# Patient Record
Sex: Female | Born: 1970 | Race: Black or African American | Hispanic: No | Marital: Single | Smoking: Current every day smoker
Health system: Southern US, Community
[De-identification: ages and names within clinical notes are randomized; demographics above are authoritative.]

## PROBLEM LIST (undated history)

## (undated) DIAGNOSIS — J439 Emphysema, unspecified: Secondary | ICD-10-CM

## (undated) DIAGNOSIS — I10 Essential (primary) hypertension: Secondary | ICD-10-CM

## (undated) DIAGNOSIS — J45909 Unspecified asthma, uncomplicated: Secondary | ICD-10-CM

## (undated) DIAGNOSIS — G43909 Migraine, unspecified, not intractable, without status migrainosus: Secondary | ICD-10-CM

## (undated) DIAGNOSIS — G4730 Sleep apnea, unspecified: Secondary | ICD-10-CM

## (undated) HISTORY — PX: ABDOMINAL HYSTERECTOMY: SHX81

## (undated) HISTORY — DX: Emphysema, unspecified: J43.9

---

## 1999-03-24 ENCOUNTER — Other Ambulatory Visit: Admission: RE | Admit: 1999-03-24 | Discharge: 1999-03-24 | Payer: Self-pay | Admitting: Gynecology

## 2000-01-03 ENCOUNTER — Inpatient Hospital Stay (HOSPITAL_COMMUNITY): Admission: AD | Admit: 2000-01-03 | Discharge: 2000-01-03 | Payer: Self-pay | Admitting: *Deleted

## 2000-01-14 ENCOUNTER — Emergency Department (HOSPITAL_COMMUNITY): Admission: EM | Admit: 2000-01-14 | Discharge: 2000-01-14 | Payer: Self-pay | Admitting: Emergency Medicine

## 2000-05-15 ENCOUNTER — Emergency Department (HOSPITAL_COMMUNITY): Admission: EM | Admit: 2000-05-15 | Discharge: 2000-05-15 | Payer: Self-pay | Admitting: Emergency Medicine

## 2000-05-29 ENCOUNTER — Emergency Department (HOSPITAL_COMMUNITY): Admission: EM | Admit: 2000-05-29 | Discharge: 2000-05-29 | Payer: Self-pay | Admitting: *Deleted

## 2000-07-01 ENCOUNTER — Encounter: Admission: RE | Admit: 2000-07-01 | Discharge: 2000-08-18 | Payer: Self-pay | Admitting: Orthopedic Surgery

## 2000-09-07 ENCOUNTER — Encounter: Admission: RE | Admit: 2000-09-07 | Discharge: 2000-09-28 | Payer: Self-pay | Admitting: Orthopedic Surgery

## 2000-09-19 ENCOUNTER — Encounter: Payer: Self-pay | Admitting: Emergency Medicine

## 2000-09-19 ENCOUNTER — Emergency Department (HOSPITAL_COMMUNITY): Admission: EM | Admit: 2000-09-19 | Discharge: 2000-09-19 | Payer: Self-pay | Admitting: Emergency Medicine

## 2000-12-22 ENCOUNTER — Emergency Department (HOSPITAL_COMMUNITY): Admission: EM | Admit: 2000-12-22 | Discharge: 2000-12-23 | Payer: Self-pay | Admitting: Emergency Medicine

## 2001-03-30 ENCOUNTER — Emergency Department (HOSPITAL_COMMUNITY): Admission: EM | Admit: 2001-03-30 | Discharge: 2001-03-30 | Payer: Self-pay | Admitting: *Deleted

## 2001-10-02 ENCOUNTER — Inpatient Hospital Stay (HOSPITAL_COMMUNITY): Admission: AD | Admit: 2001-10-02 | Discharge: 2001-10-02 | Payer: Self-pay | Admitting: Family Medicine

## 2002-03-05 ENCOUNTER — Emergency Department (HOSPITAL_COMMUNITY): Admission: EM | Admit: 2002-03-05 | Discharge: 2002-03-05 | Payer: Self-pay | Admitting: Emergency Medicine

## 2002-06-17 ENCOUNTER — Encounter (INDEPENDENT_AMBULATORY_CARE_PROVIDER_SITE_OTHER): Payer: Self-pay | Admitting: *Deleted

## 2002-06-17 LAB — CONVERTED CEMR LAB

## 2002-08-22 ENCOUNTER — Encounter: Admission: RE | Admit: 2002-08-22 | Discharge: 2002-08-22 | Payer: Self-pay | Admitting: Family Medicine

## 2002-10-20 ENCOUNTER — Encounter: Admission: RE | Admit: 2002-10-20 | Discharge: 2002-10-20 | Payer: Self-pay | Admitting: Family Medicine

## 2002-11-20 ENCOUNTER — Encounter: Admission: RE | Admit: 2002-11-20 | Discharge: 2002-11-20 | Payer: Self-pay | Admitting: Family Medicine

## 2002-12-04 ENCOUNTER — Encounter: Admission: RE | Admit: 2002-12-04 | Discharge: 2002-12-04 | Payer: Self-pay | Admitting: Family Medicine

## 2002-12-13 ENCOUNTER — Encounter: Admission: RE | Admit: 2002-12-13 | Discharge: 2002-12-13 | Payer: Self-pay | Admitting: Family Medicine

## 2002-12-21 ENCOUNTER — Emergency Department (HOSPITAL_COMMUNITY): Admission: EM | Admit: 2002-12-21 | Discharge: 2002-12-21 | Payer: Self-pay | Admitting: Emergency Medicine

## 2003-01-03 ENCOUNTER — Encounter: Admission: RE | Admit: 2003-01-03 | Discharge: 2003-01-03 | Payer: Self-pay | Admitting: Family Medicine

## 2003-03-02 ENCOUNTER — Encounter: Admission: RE | Admit: 2003-03-02 | Discharge: 2003-03-02 | Payer: Self-pay | Admitting: Family Medicine

## 2003-03-27 ENCOUNTER — Ambulatory Visit (HOSPITAL_BASED_OUTPATIENT_CLINIC_OR_DEPARTMENT_OTHER): Admission: RE | Admit: 2003-03-27 | Discharge: 2003-03-27 | Payer: Self-pay | Admitting: Sports Medicine

## 2003-03-27 ENCOUNTER — Encounter: Admission: RE | Admit: 2003-03-27 | Discharge: 2003-03-27 | Payer: Self-pay | Admitting: Family Medicine

## 2003-04-24 ENCOUNTER — Encounter: Admission: RE | Admit: 2003-04-24 | Discharge: 2003-04-24 | Payer: Self-pay | Admitting: Family Medicine

## 2003-05-29 ENCOUNTER — Encounter: Admission: RE | Admit: 2003-05-29 | Discharge: 2003-05-29 | Payer: Self-pay | Admitting: Sports Medicine

## 2003-07-10 ENCOUNTER — Encounter: Admission: RE | Admit: 2003-07-10 | Discharge: 2003-07-10 | Payer: Self-pay | Admitting: Sports Medicine

## 2003-07-10 ENCOUNTER — Encounter: Payer: Self-pay | Admitting: Sports Medicine

## 2003-08-03 ENCOUNTER — Emergency Department (HOSPITAL_COMMUNITY): Admission: EM | Admit: 2003-08-03 | Discharge: 2003-08-03 | Payer: Self-pay | Admitting: Emergency Medicine

## 2003-09-11 ENCOUNTER — Inpatient Hospital Stay (HOSPITAL_COMMUNITY): Admission: AD | Admit: 2003-09-11 | Discharge: 2003-09-11 | Payer: Self-pay | Admitting: *Deleted

## 2003-10-05 ENCOUNTER — Encounter: Admission: RE | Admit: 2003-10-05 | Discharge: 2003-10-05 | Payer: Self-pay | Admitting: Family Medicine

## 2003-10-31 ENCOUNTER — Ambulatory Visit: Payer: Self-pay | Admitting: Family Medicine

## 2003-12-13 ENCOUNTER — Ambulatory Visit: Payer: Self-pay | Admitting: Family Medicine

## 2004-01-01 ENCOUNTER — Ambulatory Visit: Payer: Self-pay | Admitting: Sports Medicine

## 2004-02-14 ENCOUNTER — Ambulatory Visit: Payer: Self-pay | Admitting: Hematology and Oncology

## 2004-02-14 ENCOUNTER — Observation Stay (HOSPITAL_COMMUNITY): Admission: AD | Admit: 2004-02-14 | Discharge: 2004-02-15 | Payer: Self-pay | Admitting: Obstetrics

## 2004-02-19 ENCOUNTER — Ambulatory Visit: Payer: Self-pay | Admitting: Hematology and Oncology

## 2004-03-06 ENCOUNTER — Inpatient Hospital Stay (HOSPITAL_COMMUNITY): Admission: RE | Admit: 2004-03-06 | Discharge: 2004-03-08 | Payer: Self-pay | Admitting: Obstetrics & Gynecology

## 2004-03-06 ENCOUNTER — Encounter (INDEPENDENT_AMBULATORY_CARE_PROVIDER_SITE_OTHER): Payer: Self-pay | Admitting: Specialist

## 2004-04-11 ENCOUNTER — Ambulatory Visit: Payer: Self-pay | Admitting: Hematology and Oncology

## 2004-05-16 ENCOUNTER — Ambulatory Visit: Payer: Self-pay | Admitting: Sports Medicine

## 2004-05-19 ENCOUNTER — Ambulatory Visit: Payer: Self-pay | Admitting: Family Medicine

## 2004-07-18 ENCOUNTER — Ambulatory Visit: Payer: Self-pay | Admitting: Family Medicine

## 2004-09-05 ENCOUNTER — Ambulatory Visit: Payer: Self-pay | Admitting: Family Medicine

## 2004-09-11 ENCOUNTER — Emergency Department (HOSPITAL_COMMUNITY): Admission: EM | Admit: 2004-09-11 | Discharge: 2004-09-11 | Payer: Self-pay | Admitting: Emergency Medicine

## 2004-09-26 ENCOUNTER — Ambulatory Visit: Payer: Self-pay | Admitting: Family Medicine

## 2004-10-02 ENCOUNTER — Ambulatory Visit: Payer: Self-pay | Admitting: Family Medicine

## 2004-10-03 ENCOUNTER — Emergency Department (HOSPITAL_COMMUNITY): Admission: EM | Admit: 2004-10-03 | Discharge: 2004-10-03 | Payer: Self-pay | Admitting: Family Medicine

## 2004-10-15 ENCOUNTER — Ambulatory Visit: Payer: Self-pay | Admitting: Family Medicine

## 2004-11-13 ENCOUNTER — Ambulatory Visit: Payer: Self-pay | Admitting: Family Medicine

## 2004-11-24 ENCOUNTER — Ambulatory Visit (HOSPITAL_COMMUNITY): Admission: RE | Admit: 2004-11-24 | Discharge: 2004-11-24 | Payer: Self-pay | Admitting: Family Medicine

## 2005-01-02 ENCOUNTER — Ambulatory Visit: Payer: Self-pay | Admitting: Family Medicine

## 2005-01-27 ENCOUNTER — Ambulatory Visit: Payer: Self-pay | Admitting: Family Medicine

## 2005-02-27 ENCOUNTER — Ambulatory Visit: Payer: Self-pay | Admitting: Sports Medicine

## 2005-05-04 ENCOUNTER — Ambulatory Visit: Payer: Self-pay | Admitting: Family Medicine

## 2005-05-25 ENCOUNTER — Emergency Department (HOSPITAL_COMMUNITY): Admission: EM | Admit: 2005-05-25 | Discharge: 2005-05-25 | Payer: Self-pay | Admitting: Emergency Medicine

## 2005-06-26 ENCOUNTER — Ambulatory Visit: Payer: Self-pay | Admitting: Family Medicine

## 2005-07-03 ENCOUNTER — Ambulatory Visit: Payer: Self-pay | Admitting: Family Medicine

## 2005-07-31 ENCOUNTER — Encounter: Admission: RE | Admit: 2005-07-31 | Discharge: 2005-07-31 | Payer: Self-pay | Admitting: Sports Medicine

## 2005-07-31 ENCOUNTER — Ambulatory Visit: Payer: Self-pay | Admitting: Family Medicine

## 2005-09-11 ENCOUNTER — Emergency Department (HOSPITAL_COMMUNITY): Admission: EM | Admit: 2005-09-11 | Discharge: 2005-09-11 | Payer: Self-pay | Admitting: Emergency Medicine

## 2005-09-21 ENCOUNTER — Ambulatory Visit: Payer: Self-pay | Admitting: Family Medicine

## 2005-10-14 ENCOUNTER — Emergency Department (HOSPITAL_COMMUNITY): Admission: EM | Admit: 2005-10-14 | Discharge: 2005-10-14 | Payer: Self-pay | Admitting: Emergency Medicine

## 2005-11-09 ENCOUNTER — Emergency Department (HOSPITAL_COMMUNITY): Admission: EM | Admit: 2005-11-09 | Discharge: 2005-11-09 | Payer: Self-pay | Admitting: Emergency Medicine

## 2006-01-11 ENCOUNTER — Ambulatory Visit: Payer: Self-pay | Admitting: Family Medicine

## 2006-01-22 IMAGING — CR DG CHEST 2V
2 series · 2 of 2 positions shown · non-contrast
Comparison: none

CLINICAL DATA: History of asthma.  Question sarcoid.
 CHEST X-RAY
 Two views of the chest are compared to a dictation of a chest x-ray from [HOSPITAL] dated 09/19/00.  The lungs are clear.  The heart is within upper limits of normal.  No adenopathy is seen.  No bony abnormality is noted.
 IMPRESSION
 No active lung disease.  No adenopathy is seen.

[view not recorded (1 of 2)]
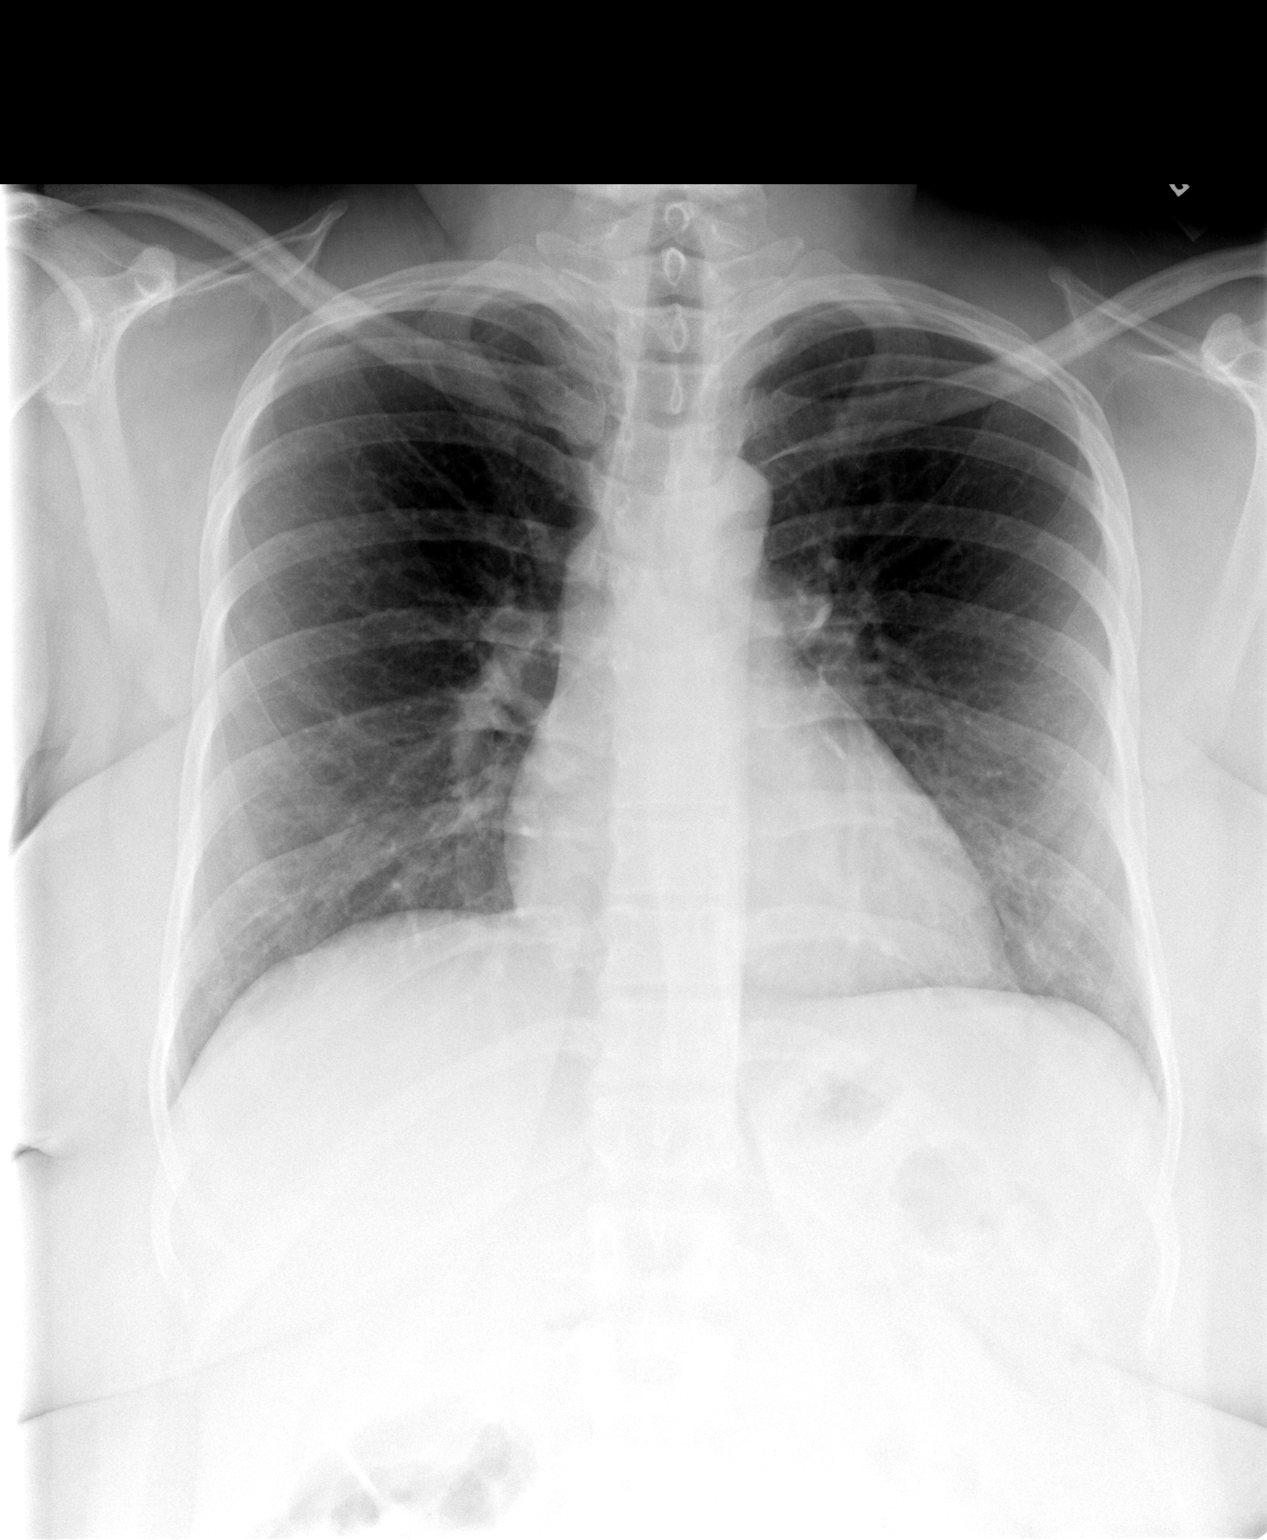

[view not recorded (2 of 2)]
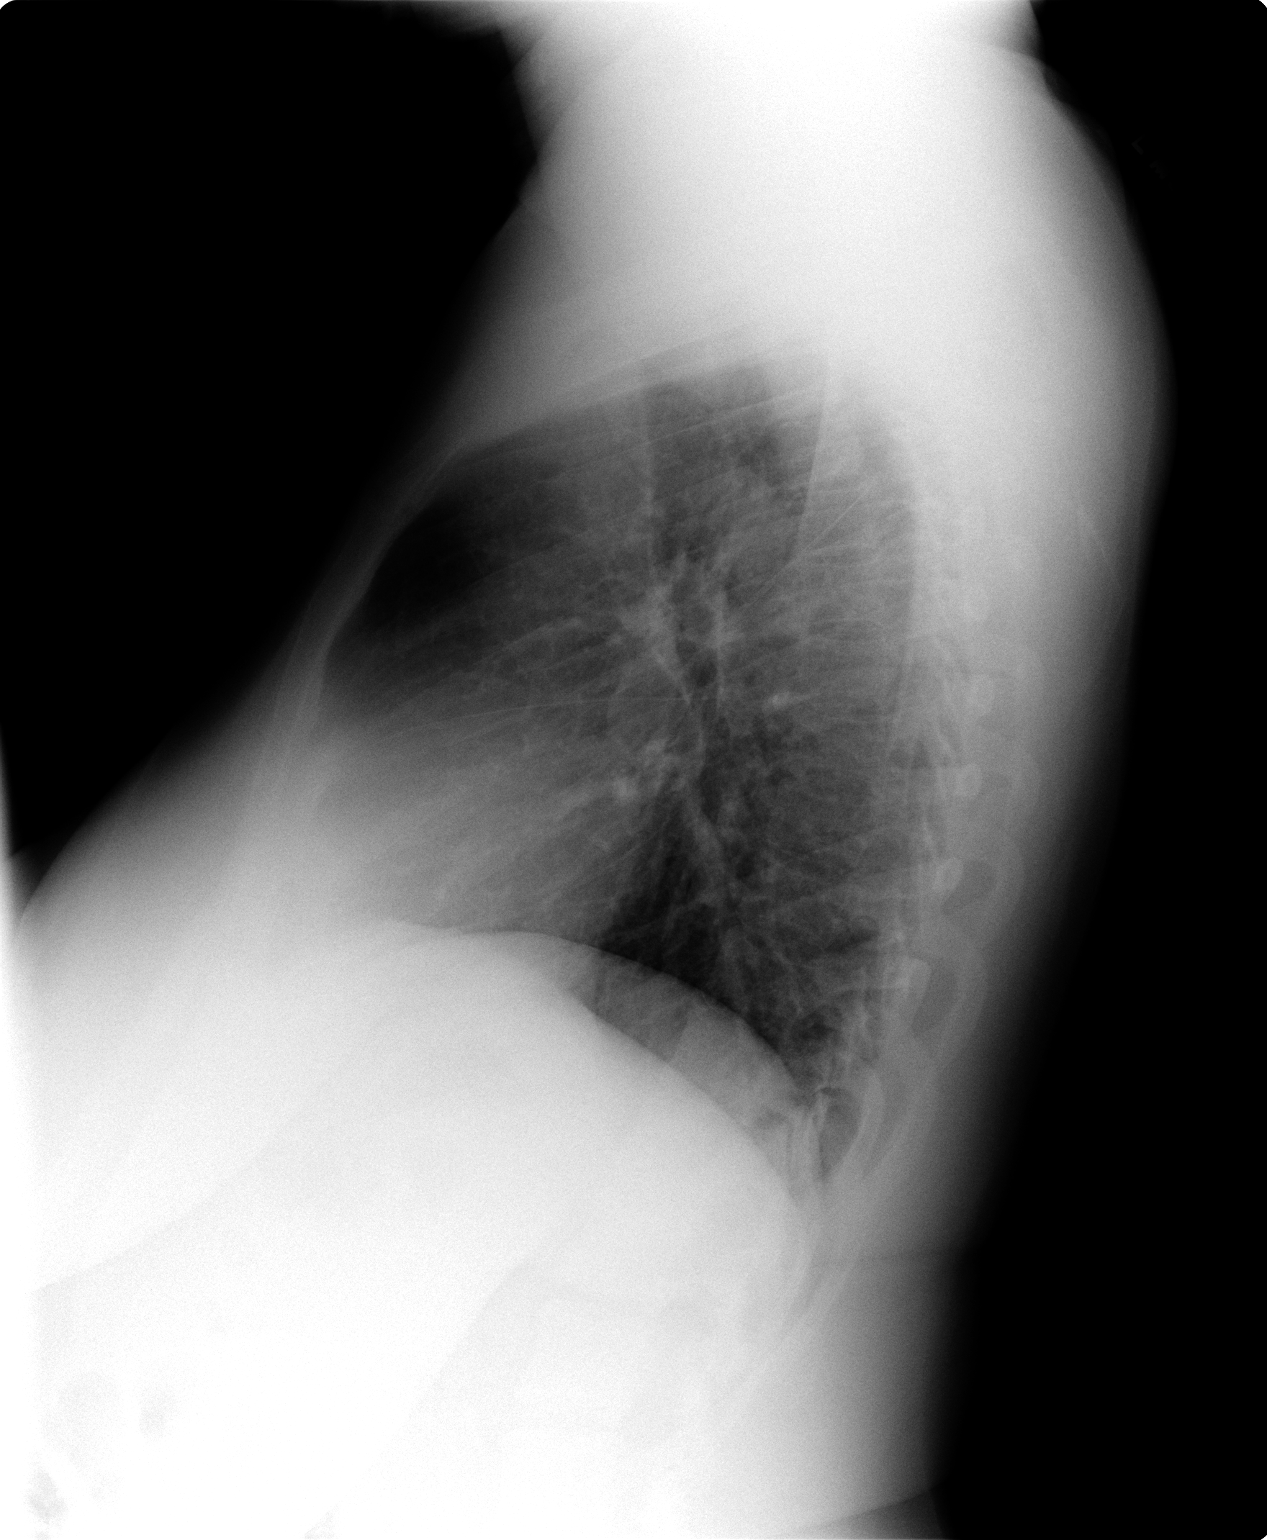

[2 of 2 positions shown; findings below may reference images not displayed]

## 2006-04-15 DIAGNOSIS — F411 Generalized anxiety disorder: Secondary | ICD-10-CM | POA: Insufficient documentation

## 2006-04-15 DIAGNOSIS — G4730 Sleep apnea, unspecified: Secondary | ICD-10-CM | POA: Insufficient documentation

## 2006-04-15 DIAGNOSIS — J45909 Unspecified asthma, uncomplicated: Secondary | ICD-10-CM | POA: Insufficient documentation

## 2006-04-15 DIAGNOSIS — E669 Obesity, unspecified: Secondary | ICD-10-CM | POA: Insufficient documentation

## 2006-04-15 DIAGNOSIS — J309 Allergic rhinitis, unspecified: Secondary | ICD-10-CM | POA: Insufficient documentation

## 2006-04-15 DIAGNOSIS — F17200 Nicotine dependence, unspecified, uncomplicated: Secondary | ICD-10-CM

## 2006-04-15 DIAGNOSIS — G43909 Migraine, unspecified, not intractable, without status migrainosus: Secondary | ICD-10-CM | POA: Insufficient documentation

## 2006-04-16 ENCOUNTER — Encounter (INDEPENDENT_AMBULATORY_CARE_PROVIDER_SITE_OTHER): Payer: Self-pay | Admitting: *Deleted

## 2006-05-05 ENCOUNTER — Telehealth: Payer: Self-pay | Admitting: *Deleted

## 2006-05-20 ENCOUNTER — Ambulatory Visit: Payer: Self-pay | Admitting: Family Medicine

## 2006-05-20 DIAGNOSIS — M549 Dorsalgia, unspecified: Secondary | ICD-10-CM | POA: Insufficient documentation

## 2006-09-29 ENCOUNTER — Encounter (INDEPENDENT_AMBULATORY_CARE_PROVIDER_SITE_OTHER): Payer: Self-pay | Admitting: *Deleted

## 2006-09-29 ENCOUNTER — Ambulatory Visit: Payer: Self-pay | Admitting: Family Medicine

## 2006-09-29 DIAGNOSIS — I10 Essential (primary) hypertension: Secondary | ICD-10-CM | POA: Insufficient documentation

## 2006-10-24 ENCOUNTER — Emergency Department (HOSPITAL_COMMUNITY): Admission: EM | Admit: 2006-10-24 | Discharge: 2006-10-24 | Payer: Self-pay | Admitting: Emergency Medicine

## 2006-11-01 ENCOUNTER — Ambulatory Visit: Payer: Self-pay | Admitting: Family Medicine

## 2006-11-01 LAB — CONVERTED CEMR LAB
Glucose, Bld: 86 mg/dL (ref 70–99)
Potassium: 3.8 meq/L (ref 3.5–5.3)
Sodium: 139 meq/L (ref 135–145)

## 2007-05-30 ENCOUNTER — Ambulatory Visit: Payer: Self-pay | Admitting: Family Medicine

## 2007-05-30 ENCOUNTER — Telehealth: Payer: Self-pay | Admitting: *Deleted

## 2007-05-31 ENCOUNTER — Telehealth: Payer: Self-pay | Admitting: *Deleted

## 2007-06-03 ENCOUNTER — Telehealth (INDEPENDENT_AMBULATORY_CARE_PROVIDER_SITE_OTHER): Payer: Self-pay | Admitting: *Deleted

## 2007-09-21 ENCOUNTER — Encounter (INDEPENDENT_AMBULATORY_CARE_PROVIDER_SITE_OTHER): Payer: Self-pay | Admitting: Family Medicine

## 2007-09-21 ENCOUNTER — Ambulatory Visit: Payer: Self-pay | Admitting: Family Medicine

## 2007-09-22 ENCOUNTER — Encounter (INDEPENDENT_AMBULATORY_CARE_PROVIDER_SITE_OTHER): Payer: Self-pay | Admitting: Family Medicine

## 2007-10-03 ENCOUNTER — Emergency Department (HOSPITAL_COMMUNITY): Admission: EM | Admit: 2007-10-03 | Discharge: 2007-10-03 | Payer: Self-pay | Admitting: Emergency Medicine

## 2007-11-22 ENCOUNTER — Telehealth: Payer: Self-pay | Admitting: Family Medicine

## 2007-11-28 ENCOUNTER — Encounter: Payer: Self-pay | Admitting: Family Medicine

## 2007-12-22 ENCOUNTER — Ambulatory Visit (HOSPITAL_COMMUNITY): Admission: RE | Admit: 2007-12-22 | Discharge: 2007-12-22 | Payer: Self-pay | Admitting: Obstetrics & Gynecology

## 2008-04-27 ENCOUNTER — Encounter: Payer: Self-pay | Admitting: Family Medicine

## 2008-05-26 ENCOUNTER — Emergency Department (HOSPITAL_COMMUNITY): Admission: EM | Admit: 2008-05-26 | Discharge: 2008-05-27 | Payer: Self-pay | Admitting: Emergency Medicine

## 2008-06-27 ENCOUNTER — Ambulatory Visit: Payer: Self-pay | Admitting: Family Medicine

## 2008-07-04 ENCOUNTER — Telehealth: Payer: Self-pay | Admitting: *Deleted

## 2008-07-23 ENCOUNTER — Ambulatory Visit: Payer: Self-pay | Admitting: Family Medicine

## 2008-07-23 ENCOUNTER — Telehealth: Payer: Self-pay | Admitting: Family Medicine

## 2008-07-23 ENCOUNTER — Encounter: Payer: Self-pay | Admitting: Family Medicine

## 2008-07-24 ENCOUNTER — Encounter: Admission: RE | Admit: 2008-07-24 | Discharge: 2008-07-24 | Payer: Self-pay | Admitting: Family Medicine

## 2008-07-26 ENCOUNTER — Telehealth (INDEPENDENT_AMBULATORY_CARE_PROVIDER_SITE_OTHER): Payer: Self-pay | Admitting: *Deleted

## 2008-07-27 ENCOUNTER — Encounter: Payer: Self-pay | Admitting: Family Medicine

## 2008-08-01 ENCOUNTER — Encounter: Payer: Self-pay | Admitting: Family Medicine

## 2008-08-03 ENCOUNTER — Ambulatory Visit: Payer: Self-pay | Admitting: Family Medicine

## 2008-08-03 ENCOUNTER — Telehealth: Payer: Self-pay | Admitting: *Deleted

## 2008-08-07 ENCOUNTER — Encounter: Payer: Self-pay | Admitting: Family Medicine

## 2008-08-07 ENCOUNTER — Ambulatory Visit: Payer: Self-pay | Admitting: Family Medicine

## 2008-08-07 ENCOUNTER — Telehealth: Payer: Self-pay | Admitting: Family Medicine

## 2008-08-07 LAB — CONVERTED CEMR LAB
BUN: 11 mg/dL (ref 6–23)
Sodium: 141 meq/L (ref 135–145)

## 2008-08-11 ENCOUNTER — Encounter: Admission: RE | Admit: 2008-08-11 | Discharge: 2008-08-11 | Payer: Self-pay | Admitting: Family Medicine

## 2008-08-15 ENCOUNTER — Telehealth: Payer: Self-pay | Admitting: Family Medicine

## 2008-08-15 ENCOUNTER — Encounter: Payer: Self-pay | Admitting: Family Medicine

## 2008-08-15 DIAGNOSIS — M1710 Unilateral primary osteoarthritis, unspecified knee: Secondary | ICD-10-CM

## 2008-09-27 ENCOUNTER — Encounter: Payer: Self-pay | Admitting: Family Medicine

## 2008-09-27 ENCOUNTER — Ambulatory Visit: Payer: Self-pay | Admitting: Family Medicine

## 2008-09-27 ENCOUNTER — Telehealth: Payer: Self-pay | Admitting: Family Medicine

## 2008-09-27 LAB — CONVERTED CEMR LAB
BUN: 9 mg/dL (ref 6–23)
CO2: 25 meq/L (ref 19–32)
Calcium: 9.1 mg/dL (ref 8.4–10.5)
Chloride: 106 meq/L (ref 96–112)
Creatinine, Ser: 0.67 mg/dL (ref 0.40–1.20)
Glucose, Bld: 77 mg/dL (ref 70–99)
Potassium: 4 meq/L (ref 3.5–5.3)

## 2008-10-03 ENCOUNTER — Ambulatory Visit: Payer: Self-pay | Admitting: Family Medicine

## 2008-10-03 ENCOUNTER — Encounter: Payer: Self-pay | Admitting: Family Medicine

## 2008-10-03 LAB — CONVERTED CEMR LAB
CO2: 26 meq/L (ref 19–32)
Calcium: 10.2 mg/dL (ref 8.4–10.5)
Chloride: 102 meq/L (ref 96–112)
Creatinine, Ser: 0.81 mg/dL (ref 0.40–1.20)
Potassium: 3.6 meq/L (ref 3.5–5.3)
Sodium: 139 meq/L (ref 135–145)

## 2008-10-04 ENCOUNTER — Encounter: Payer: Self-pay | Admitting: Family Medicine

## 2008-11-26 ENCOUNTER — Ambulatory Visit (HOSPITAL_COMMUNITY): Admission: RE | Admit: 2008-11-26 | Discharge: 2008-11-26 | Payer: Self-pay | Admitting: Family Medicine

## 2008-11-26 ENCOUNTER — Ambulatory Visit: Payer: Self-pay | Admitting: Family Medicine

## 2008-11-26 DIAGNOSIS — K219 Gastro-esophageal reflux disease without esophagitis: Secondary | ICD-10-CM

## 2008-11-28 ENCOUNTER — Encounter: Payer: Self-pay | Admitting: Family Medicine

## 2008-11-30 ENCOUNTER — Encounter: Payer: Self-pay | Admitting: Family Medicine

## 2008-12-03 ENCOUNTER — Encounter: Payer: Self-pay | Admitting: Family Medicine

## 2008-12-14 ENCOUNTER — Ambulatory Visit: Payer: Self-pay | Admitting: Family Medicine

## 2008-12-20 ENCOUNTER — Encounter: Payer: Self-pay | Admitting: *Deleted

## 2009-01-03 ENCOUNTER — Telehealth: Payer: Self-pay | Admitting: Family Medicine

## 2009-01-03 ENCOUNTER — Ambulatory Visit: Payer: Self-pay | Admitting: Family Medicine

## 2009-01-04 ENCOUNTER — Encounter: Payer: Self-pay | Admitting: Family Medicine

## 2009-01-08 ENCOUNTER — Ambulatory Visit: Payer: Self-pay | Admitting: Family Medicine

## 2009-01-15 ENCOUNTER — Encounter: Payer: Self-pay | Admitting: Family Medicine

## 2009-02-20 ENCOUNTER — Encounter: Payer: Self-pay | Admitting: Family Medicine

## 2009-02-20 ENCOUNTER — Ambulatory Visit: Payer: Self-pay | Admitting: Family Medicine

## 2009-02-20 LAB — CONVERTED CEMR LAB: Rapid Strep: NEGATIVE

## 2009-03-06 ENCOUNTER — Telehealth: Payer: Self-pay | Admitting: Family Medicine

## 2009-03-14 ENCOUNTER — Ambulatory Visit: Payer: Self-pay | Admitting: Family Medicine

## 2009-04-15 ENCOUNTER — Ambulatory Visit: Payer: Self-pay | Admitting: Family Medicine

## 2009-04-15 ENCOUNTER — Telehealth: Payer: Self-pay | Admitting: Family Medicine

## 2009-05-10 ENCOUNTER — Encounter: Payer: Self-pay | Admitting: *Deleted

## 2009-05-10 ENCOUNTER — Ambulatory Visit: Payer: Self-pay | Admitting: Family Medicine

## 2009-05-21 ENCOUNTER — Ambulatory Visit: Payer: Self-pay | Admitting: Family Medicine

## 2009-07-23 ENCOUNTER — Encounter: Payer: Self-pay | Admitting: Family Medicine

## 2009-07-29 ENCOUNTER — Ambulatory Visit: Payer: Self-pay | Admitting: Family Medicine

## 2009-08-11 ENCOUNTER — Ambulatory Visit: Payer: Self-pay | Admitting: Obstetrics and Gynecology

## 2009-08-11 ENCOUNTER — Inpatient Hospital Stay (HOSPITAL_COMMUNITY): Admission: AD | Admit: 2009-08-11 | Discharge: 2009-08-11 | Payer: Self-pay | Admitting: Obstetrics

## 2009-10-16 ENCOUNTER — Encounter: Payer: Self-pay | Admitting: Family Medicine

## 2010-01-08 ENCOUNTER — Emergency Department (HOSPITAL_COMMUNITY): Admission: EM | Admit: 2010-01-08 | Discharge: 2010-01-08 | Payer: Self-pay | Admitting: Emergency Medicine

## 2010-03-20 NOTE — Assessment & Plan Note (Signed)
Summary: acute bronchitis   Vital Signs:  Patient profile:   40 year old female Height:      63 inches Weight:      238.9 pounds BMI:     42.47 O2 Sat:      100 % on Room air Temp:     98.6 degrees F oral Pulse rate:   78 / minute Resp:     14 per minute BP sitting:   169 / 113  (left arm) Cuff size:   large  Vitals Entered By: Gladstone Pih (April 15, 2009 1:55 PM)  O2 Flow:  Room air  Serial Vital Signs/Assessments:  Time      Position  BP       Pulse  Resp  Temp     By 1:56 PM             162/110                        Gladstone Pih  Comments: 1:56 PM re ck manually By: Gladstone Pih   CC: C/O cough and asthma Is Patient Diabetic? No Pain Assessment Patient in pain? no        Primary Care Provider:  Magnus Ivan MD  CC:  C/O cough and asthma.  History of Present Illness: 40yo F w/ cold symptoms and SOB  Cold symptoms: x 9 days.  Symptoms worsened.  Coughing, pleuritic chest pain w/ cough, chest congestion, wheezing (underlying asthma).  No vomiting, diarrhea, or fevers.  Daughter and son with similar symptoms but now resolved.  Currently taking thermaflu, sudafed, and nyquil.  She is out of her albuterol inhaler.    Habits & Providers  Alcohol-Tobacco-Diet     Tobacco Status: current     Tobacco Counseling: to quit use of tobacco products     Cigarette Packs/Day: 1.0  Current Medications (verified): 1)  Proventil Hfa 108 (90 Base) Mcg/act Aers (Albuterol Sulfate) .... 2 Puffs Q 4 Hours As Needed Wheeze. 2)  Hydrochlorothiazide 25 Mg Tabs (Hydrochlorothiazide) .Marland Kitchen.. 1 Tab By Mouth Daily For Blood Pressure 3)  Ranitidine Hcl 150 Mg Caps (Ranitidine Hcl) .... Take One Tablet Twice A Day (Reflux) 4)  Atenolol 50 Mg Tabs (Atenolol) .... Take 1 Tablet Once A Day 5)  Ibuprofen 800 Mg Tabs (Ibuprofen) .... Take 1 Pill By Mouth Three Times A Day As Needed For Pain 6)  Tessalon 200 Mg Caps (Benzonatate) .Marland Kitchen.. 1 Tablet By Mouth Three Times A Day As Needed For  Cough  Allergies (verified): 1)  ! Ace Inhibitors 2)  ! * Cymbalta 3)  Flagyl (Metronidazole) 4)  Metoprolol Succinate  Review of Systems       no cp or palpitations  Physical Exam  General:  VS Reviewed and Rechecked. Well appearing, NAD.  Head:  no sinus ttp Eyes:  no injected conjunctiva Mouth:  moist mucus membranes no erythema, edema, or exudative tonsils Neck:  supple, full ROM, no goiter or mass  Lungs:  moving air in all lung fields but coarse bs throughout and mild wheezing Heart:  Normal rate and regular rhythm. S1 and S2 normal without gallop, murmur, click, rub or other extra sounds. Abdomen:  Soft, NT, ND, no HSM, active BS  Skin:  nl color and turgor   Impression & Recommendations:  Problem # 1:  ACUTE BRONCHITIS (ICD-466.0) Assessment New  Symptoms c/w acute bronchitis.  No fevers.  She has coarse breath sounds with wheezing throughout. Plan to  provide albuterol inhaler and schedule use every 4 hours x 48 hours.  Will treat with supportive care and otc cough meds for now. She is to pick up the tessalon perles if cough doesn't improve. She is to call me in 48 hours if symptoms not improving and I'll consider obtaining an xray at that time or possibly starting her on an antibiotic.   I'm hesitant to do either one today with her being afebrile and O2 sat 100% on RA.    The following medications were removed from the medication list:    Amoxicillin 500 Mg Caps (Amoxicillin) .Marland Kitchen... Take 2 tablets three times a day for 5 days Her updated medication list for this problem includes:    Proventil Hfa 108 (90 Base) Mcg/act Aers (Albuterol sulfate) .Marland Kitchen... 2 puffs q 4 hours as needed wheeze.    Tessalon 200 Mg Caps (Benzonatate) .Marland Kitchen... 1 tablet by mouth three times a day as needed for cough  Orders: [facility]- Est  Level 4 ([address])  Problem # 2:  HYPERTENSION (ICD-401.9) Assessment: Deteriorated Not at goal.  Suspect acute elevation of BP due to acute illness and OTC cold  medications.  She is to check her BP at home and if it remains elevated even after the resolution of her bronchitis, she is to f/u with Dr. Mayford Knife.  Her updated medication list for this problem includes:    Hydrochlorothiazide 25 Mg Tabs (Hydrochlorothiazide) .Marland Kitchen... 1 tab by mouth daily for blood pressure    Atenolol 50 Mg Tabs (Atenolol) .Marland Kitchen... Take 1 tablet once a day  Orders: [facility]- Est  Level 4 ([address]214)  Complete Medication List: 1)  Proventil Hfa 108 (90 Base) Mcg/act Aers (Albuterol sulfate) .... 2 puffs q 4 hours as needed wheeze. 2)  Hydrochlorothiazide 25 Mg Tabs (Hydrochlorothiazide) .Marland Kitchen.. 1 tab by mouth daily for blood pressure 3)  Ranitidine Hcl 150 Mg Caps (Ranitidine hcl) .... Take one tablet twice a day (reflux) 4)  Atenolol 50 Mg Tabs (Atenolol) .... Take 1 tablet once a day 5)  Ibuprofen 800 Mg Tabs (Ibuprofen) .... Take 1 pill by mouth three times a day as needed for pain 6)  Tessalon 200 Mg Caps (Benzonatate) .Marland Kitchen.. 1 tablet by mouth three times a day as needed for cough  Patient Instructions: 1)  Call me at the clinic in 2 days if you're not improving on the albuterol which you are to do every 4 hours for the next 2 days. 2)  Also start the mucinex DM first then use the tessalon perles as needed. 3)  Check your blood pressure at home.  If you continue to run high, I want you to follow up with Dr. Mayford Knife. Prescriptions: TESSALON 200 MG CAPS (BENZONATATE) 1 tablet by mouth three times a day as needed for cough  #30 x 0   Entered and Authorized by:   Marisue Ivan  MD   Signed by:   Marisue Ivan  MD on 04/15/2009   Method used:   Electronically to        [facility] [phone]* (retail)       [address]       [place], [place]  [address]       Ph: [phone]       Fax: [phone]   RxID:   [id number] PROVENTIL HFA 108 (90 BASE) MCG/ACT AERS (ALBUTEROL SULFATE) 2 puffs q 4 hours as needed wheeze.  #1 x 1   Entered and Authorized by:   Marisue Ivan  MD   Signed by:   Marisue Ivan  MD on 04/15/2009   Method used:   Electronically to        [facility] [phone]* (retail)       [address]       [place], [place]  [address]       Ph: [phone]       Fax: [phone]   RxID:   [id number]     Prevention & Chronic Care Immunizations   Influenza vaccine: Fluvax Non-MCR  (01/08/2009)   Influenza vaccine due: Not Indicated    Tetanus booster: 09/21/2007: Tdap   Tetanus booster due: 02/16/2006    Pneumococcal vaccine: Not documented  Other Screening   Pap smear: normal  (01/27/2008)   Pap smear due: 01/26/2009   Smoking status: current  (04/15/2009)   Smoking cessation counseling: YES  (06/27/2008)  Lipids   Total Cholesterol: Not documented   LDL: Not documented   LDL Direct: Not documented   HDL: Not documented   Triglycerides: Not documented  Hypertension   Last Blood Pressure: 169 / 113  (04/15/2009)   Serum creatinine: 0.81  (10/03/2008)   Serum potassium 3.6  (10/03/2008)    Hypertension flowsheet reviewed?: Yes   Progress toward BP goal: Deteriorated  Self-Management Support :   Personal Goals (by the next clinic visit) :      Personal blood pressure goal: 140/90  (11/26/2008)   Patient will work on the following items until the next clinic visit to reach self-care goals:     Medications and monitoring: take my medicines every day, check my blood pressure  (04/15/2009)     Eating: drink diet soda or water instead of juice or soda, eat more vegetables, use fresh or frozen vegetables, eat foods that are low in salt, eat baked foods instead of fried foods, eat fruit for snacks and desserts, limit or avoid alcohol  (04/15/2009)    Hypertension self-management support: BP self-monitoring log, Written self-care plan, Education handout  (04/15/2009)   Hypertension self-care plan printed.   Hypertension education handout printed

## 2010-03-20 NOTE — Assessment & Plan Note (Signed)
Summary: sore throat/ear pain,tcb   Vital Signs:  Patient profile:   40 year old female Height:      63 inches Weight:      243.1 pounds BMI:     43.22 Temp:     97.5 degrees F oral Pulse rate:   98 / minute BP sitting:   160 / 110  (left arm) Cuff size:   large  Vitals Entered By: Garen Grams LPN (February 20, 2009 3:16 PM) CC: ear pain, sore throat, cough/congestion x 3 days Is Patient Diabetic? No   Primary Care Provider:  Magnus Ivan MD  CC:  ear pain, sore throat, and cough/congestion x 3 days.  History of Present Illness: 1. sore throat, congestion Sore throat since Sunday. Congestion, ear pain L >R , cough since that time. Getting yellow phlegm up with cough. BF and mother have had similar but less severe symptoms.    ROS: + for mild subjective fever, nausea. no chest pain, SOB, problems with vision or speech  2. blood pressure On amlodipine, atenolol and HCTZ. Taking as directed . Thinks one of these is making her constipated. Doesn't currently have a follow-up appointment with Dr. Mayford Knife.  Habits & Providers  Alcohol-Tobacco-Diet     Tobacco Status: current     Cigarette Packs/Day: 1.0  Current Medications (verified): 1)  Ambien 5 Mg  Tabs (Zolpidem Tartrate) 2)  Proventil Hfa 108 (90 Base) Mcg/act Aers (Albuterol Sulfate) .... 2 Puffs Q 4 Hours As Needed Wheeze. 3)  Hydrochlorothiazide 25 Mg Tabs (Hydrochlorothiazide) .Marland Kitchen.. 1 Tab By Mouth Daily For Blood Pressure 4)  Amlodipine Besylate 10 Mg Tabs (Amlodipine Besylate) .... Take One Tablet Daily 5)  Ranitidine Hcl 150 Mg Caps (Ranitidine Hcl) .... Take One Tablet Twice A Day (Reflux) 6)  Atenolol 25 Mg Tabs (Atenolol) .... Take One Pill By Mouth Once A Day 7)  Ibuprofen 800 Mg Tabs (Ibuprofen) .... Take 1 Pill By Mouth Three Times A Day As Needed For Pain 8)  Tramadol Hcl 50 Mg Tabs (Tramadol Hcl) .... Take 1 Pill Every 4-6 Hours As Needed For Pain 9)  Azithromycin 500 Mg Tabs (Azithromycin) .... One By  Mouth Daily For 3 Days  Allergies (verified): 1)  ! Ace Inhibitors 2)  ! * Cymbalta 3)  Flagyl (Metronidazole) 4)  Metoprolol Succinate  Physical Exam  General:  somewhat ill-appearing. Appropriate with interview/exam. VSS reviewed. Hypertensive, obese Eyes:  mild right conjuncitval erythema. EOMI.  Ears:  R TM erythematous, mild bulging. L TM mildly erythematous. Mouth:  OP pink and moist. Tonsils nonenlarged. No exudate.  Neck:  Mild L anterior chain LAD. TTP. Normal neck flexion/extension. Lungs:  work of breathing unlabored, clear to auscultation bilaterally; no wheezes, rales, or ronchi; good air movement throughout  Heart:  regular rate and rhythm, no murmurs; normal s1/s2  Skin:  Intact without suspicious lesions or rashes   Impression & Recommendations:  Problem # 1:  SORE THROAT (ICD-462) Assessment New azithro given likely ear infection. ibuprofen for symptom control. Will defer decongestants given hypertension. Advised adequate hydration. Return parameters discussed.  Patient agreeable.  Her updated medication list for this problem includes:    Ibuprofen 800 Mg Tabs (Ibuprofen) .Marland Kitchen... Take 1 pill by mouth three times a day as needed for pain    Azithromycin 500 Mg Tabs (Azithromycin) ..... One by mouth daily for 3 days  Orders: Rapid Strep-[facility] ([address]) [facility]- Est Level  3 ([address]454)  Problem # 2:  HYPERTENSION (ICD-401.9) Assessment: Unchanged  remains elevated. have back soon for follow-up with PCP. reviewed goal BP and gave educational handout. Reviewed risks of longstanding HTN Her updated medication list for this problem includes:    Hydrochlorothiazide 25 Mg Tabs (Hydrochlorothiazide) .Marland Kitchen... 1 tab by mouth daily for blood pressure    Amlodipine Besylate 10 Mg Tabs (Amlodipine besylate) .Marland Kitchen... Take one tablet daily    Atenolol 25 Mg Tabs (Atenolol) .Marland Kitchen... Take one pill by mouth once a day  Orders: [facility]- Est Level  3 ([address])  Complete Medication List: 1)  Ambien 5 Mg  Tabs (Zolpidem tartrate) 2)  Proventil Hfa 108 (90 Base) Mcg/act Aers (Albuterol sulfate) .... 2 puffs q 4 hours as needed wheeze. 3)  Hydrochlorothiazide 25 Mg Tabs (Hydrochlorothiazide) .Marland Kitchen.. 1 tab by mouth daily for blood pressure 4)  Amlodipine Besylate 10 Mg Tabs (Amlodipine besylate) .... Take one tablet daily 5)  Ranitidine Hcl 150 Mg Caps (Ranitidine hcl) .... Take one tablet twice a day (reflux) 6)  Atenolol 25 Mg Tabs (Atenolol) .... Take one pill by mouth once a day 7)  Ibuprofen 800 Mg Tabs (Ibuprofen) .... Take 1 pill by mouth three times a day as needed for pain 8)  Tramadol Hcl 50 Mg Tabs (Tramadol hcl) .... Take 1 pill every 4-6 hours as needed for pain 9)  Azithromycin 500 Mg Tabs (Azithromycin) .... One by mouth daily for 3 days  Patient Instructions: 1)  take the antibiotic for 3 days. 2)  drink lots of fluids. 3)  take ibuprofen for your sore throat and headache. 4)  Important: schedule a follow-up appointment with Dr. Mayford Knife for your blood pressure. Prescriptions: AZITHROMYCIN 500 MG TABS (AZITHROMYCIN) one by mouth daily for 3 days  #3 x 0   Entered and Authorized by:   Myrtie Soman  MD   Signed by:   Myrtie Soman  MD on 02/20/2009   Method used:   Electronically to        [facility] [phone]* (retail)       [address]       [place], [place]  [address]       Ph: [phone]       Fax: [phone]   RxID:   [id number]    Prevention & Chronic Care Immunizations   Influenza vaccine: Fluvax Non-MCR  (01/08/2009)   Influenza vaccine due: Not Indicated    Tetanus booster: 09/21/2007: Tdap   Tetanus booster due: 02/16/2006    Pneumococcal vaccine: Not documented  Other Screening   Pap smear: normal  (01/27/2008)   Pap smear due: 01/26/2009   Smoking status: current  (02/20/2009)   Smoking cessation counseling: YES  (06/27/2008)  Lipids   Total Cholesterol: Not documented   LDL: Not documented   LDL Direct: Not documented   HDL: Not  documented   Triglycerides: Not documented  Hypertension   Last Blood Pressure: 160 / 110  (02/20/2009)   Serum creatinine: 0.81  (10/03/2008)   Serum potassium 3.6  (10/03/2008)    Hypertension flowsheet reviewed?: Yes   Progress toward BP goal: Deteriorated  Self-Management Support :   Personal Goals (by the next clinic visit) :      Personal blood pressure goal: 140/90  (11/26/2008)   Hypertension self-management support: Written self-care plan, Education handout, Pre-printed educational material  (02/20/2009)   Hypertension self-care plan printed.   Hypertension education handout printed   Laboratory Results  Date/Time Received: February 20, 2009 3:27 PM  Date/Time Reported: February 20, 2009 3:40  PM   Other Tests  Rapid Strep: negative Comments: ...............test performed by......Marland KitchenBonnie A. [place], MLS (ASCP)cm

## 2010-03-20 NOTE — Miscellaneous (Signed)
  Clinical Lists Changes  Problems: Removed problem of CONSTIPATION (ICD-564.00) Removed problem of ACUTE BRONCHITIS (ICD-466.0) Removed problem of EAR PAIN, RIGHT (ICD-388.70) Removed problem of SORE THROAT (ICD-462) Removed problem of CHEST PAIN (ICD-786.50) Changed problem from ASTHMA, UNSPECIFIED (ICD-493.90) to ASTHMA, INTERMITTENT (ICD-493.90)

## 2010-03-20 NOTE — Miscellaneous (Signed)
Summary: FMLA  Patient dropped off FMLA papers to be filled out.  Please call her when completed. Bradly Bienenstock  July 23, 2009 5:04 PM  to Dr. Donnetta Hail chart box.Golden Circle RN  July 24, 2009 8:42 AM TRIAGE  I have no idea what this is for (the [facility]). Since I have not seen her and I see nothing in Dr Minna Antis last note re any FMLA--I suggest she needs appt w someone--not me--preferrably her new provider to getthese filled out Thanks!  Denny Levy MD  July 25, 2009 9:46 AM   she dropped off another form now. told front office to make her an appt for this fmla with one of the mds she has seen before. she does not want to wait for new md..Sally Elijah Birk RN  July 25, 2009 2:35 PM  Appended Document: FMLA to Dr. Lelon Perla as new pcp

## 2010-03-20 NOTE — Letter (Signed)
Summary: Out of Work  [facility]  [address]   [place], [place] [address]   Phone: [phone]  Fax: [phone]    February 20, 2009   Employee:  LAURANA MAGISTRO    To Whom It May Concern:   For Medical reasons, please excuse the above named employee from work for the following dates:  Start:  February 20, 2009   End:   February 22, 2009  If you need additional information, please feel free to contact our office.         Sincerely,    Myrtie Soman  MD
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
## 2010-03-20 NOTE — Progress Notes (Signed)
Summary: meds prob  Phone Note Call from Patient Call back at Home Phone [phone]   Caller: Patient Summary of Call: Cimbalta Amlodipine Ranitidine is not sure if any of these have inhibitors in them and wants to talk to nurse  Initial call taken by: De Nurse,  March 06, 2009 1:35 PM  Follow-up for Phone Call        c/o facial & body swelling when she takes one of these meds. states she is allergic to ACE inhibitors. she also has skin break outs on her face. they go away when she stops the meds. wants to know which one needs to be changed. told her pcp is here & will call her with md response Follow-up by: Golden Circle RN,  March 06, 2009 1:46 PM  Additional Follow-up for Phone Call Additional follow up Details #1::        amlodipine can cause peripheral swelling.  a small chance ranitidine or cymbalta can cause angioedema.  she needs to make another appointment in order to figure out how to make alternative changes in her meds. Additional Follow-up by: Magnus Ivan MD,  March 06, 2009 4:50 PM     Appended Document: meds prob Spoke with pt, voiced understand.  Stated she had apt this afternoon with Dr Mayford Knife, let her know her apt is next Thur at 330pm..Lisa Klusman Lpn

## 2010-03-20 NOTE — Assessment & Plan Note (Signed)
Summary: high bps today/[place]   Vital Signs:  Patient profile:   40 year old female Height:      63 inches Weight:      239 pounds BMI:     42.49 BSA:     2.09 Temp:     98.2 degrees F Pulse rate:   74 / minute BP sitting:   164 / 100  Vitals Entered By: Jone Baseman CMA (May 10, 2009 1:41 PM)  Serial Vital Signs/Assessments:  Time      Position  BP       Pulse  Resp  Temp     By 2:34 PM             155/90                         Angelena Sole MD  Comments: 2:34 PM manual By: Angelena Sole MD   CC: High Bps today @ other MD Is Patient Diabetic? No Pain Assessment Patient in pain? no        Primary Care Provider:  Magnus Ivan MD  CC:  High Bps today @ other MD.  History of Present Illness: 1. High Blood Pressure:  Pt was at [facility] center for a routine visit when the found that she had elevated blood pressure.  It was 184/123 in her right arm and 181/113 in her left arm using an automatic BP cuff.  This was before she had taken any of her blood pressure medicines for the day.  Overall she is compliant with her medications and takes them as prescribed.  She was asymptomatic from the blood pressure.  She denied any chest pain, shortness of breath, vision changes.  She took her blood pressure medicines and then it was rechecked and her BP it was 170/113.  She does not check her blood pressure at home regularly.  Hasn't been watching her salt intake.  2. Constipation:  She thinks that the medicines that she is taking are making her constipated.  If she does not taker her medicines than she will have 2 BM in a day.  When she takes her medicines she will have a bowel movement maybe every other day.  She drinks some water but not 10 glasses per day.  She doesn't take a fiber supplement.  Has tried Miralax but not consistently.  Habits & Providers  Alcohol-Tobacco-Diet     Tobacco Status: current     Tobacco Counseling: to quit use of tobacco  products     Cigarette Packs/Day: 0.5  Current Medications (verified): 1)  Proventil Hfa 108 (90 Base) Mcg/act Aers (Albuterol Sulfate) .... 2 Puffs Q 4 Hours As Needed Wheeze. 2)  Hydrochlorothiazide 25 Mg Tabs (Hydrochlorothiazide) .Marland Kitchen.. 1 Tab By Mouth Daily For Blood Pressure 3)  Ranitidine Hcl 150 Mg Caps (Ranitidine Hcl) .... Take One Tablet Twice A Day (Reflux) 4)  Atenolol 50 Mg Tabs (Atenolol) .... Take 2 Tablets Once A Day 5)  Ibuprofen 800 Mg Tabs (Ibuprofen) .... Take 1 Pill By Mouth Three Times A Day As Needed For Pain 6)  Tessalon 200 Mg Caps (Benzonatate) .Marland Kitchen.. 1 Tablet By Mouth Three Times A Day As Needed For Cough 7)  Colace 100 Mg Caps (Docusate Sodium) .... Take 1 Tab By Mouth Twice A Day 8)  Amlodipine Besylate 10 Mg Tabs (Amlodipine Besylate) .... Take 1 Tab By Mouth Daily  Allergies (verified): 1)  ! Ace Inhibitors 2)  ! * Cymbalta  3)  Flagyl (Metronidazole) 4)  Metoprolol Succinate  Past History:  Past Medical History: Reviewed history from 09/27/2008 and no changes required. CPAP 14 cwp -2005, Lunesta 2mg  po qhs to sleep w/CPAP, no seizures since 40yo., seizures age [age]yo, tx`d w/phenobarb-no tx and, Took Maxalt for migraines- was prescribed by neuro htn  Social History: Reviewed history from 04/15/2006 and no changes required. Lives in [place] w/10yo daughter, 12yo son, mother.  Married 2005. Clericalt job in Clinical biochemist.   Hobbied-reading, TV, movies.  Smokes 1ppd for anxiety, no ETOH/drugs.; Walking=exercisePacks/Day:  0.5  Review of Systems       The patient complains of headaches.  The patient denies fever, vision loss, chest pain, syncope, dyspnea on exertion, peripheral edema, prolonged cough, abdominal pain, melena, and severe indigestion/heartburn.    Physical Exam  General:  VS Reviewed and Rechecked. Well appearing, NAD.  Head:  normocephalic and atraumatic.   Eyes:  vision grossly intact, pupils equal, pupils round, and pupils reactive to  light.  Fundoscopic exam benign Mouth:  moist mucus membranes no erythema, edema, or exudative tonsils Neck:  supple, full ROM, no goiter or mass  Lungs:  normal WOB, CTAB Heart:  Normal rate and regular rhythm. S1 and S2 normal without gallop, murmur, click, rub or other extra sounds. Abdomen:  Soft, NT, ND, no HSM, active BS  Extremities:  no lower extremity edema Neurologic:  cranial nerves II-XII intact.  strength normal in all extremities, sensation intact to light touch, and gait normal.   Skin:  turgor normal and color normal.   Psych:  not anxious appearing and not depressed appearing.     Impression & Recommendations:  Problem # 1:  HYPERTENSION (ICD-401.9) Assessment Unchanged  At her baseline.  Will not work her up for hypertensive urgency.  Will increase Atenolol to 100mg  daily as well as add on Norvasc for better blood pressure control Her updated medication list for this problem includes:    Hydrochlorothiazide 25 Mg Tabs (Hydrochlorothiazide) .Marland Kitchen... 1 tab by mouth daily for blood pressure    Atenolol 50 Mg Tabs (Atenolol) .Marland Kitchen... Take 2 tablets once a day    Amlodipine Besylate 10 Mg Tabs (Amlodipine besylate) .Marland Kitchen... Take 1 tab by mouth daily  Orders: [facility]- Est  Level 4 (19147)  Problem # 2:  CONSTIPATION (ICD-564.00) Assessment: New  Add Colace for stool softener.  Told to increase amout of water intake and to start a fiber supplement daily. Her updated medication list for this problem includes:    Colace 100 Mg Caps (Docusate sodium) .Marland Kitchen... Take 1 tab by mouth twice a day  Orders: [facility]- Est  Level 4 (99214)  Complete Medication List: 1)  Proventil Hfa 108 (90 Base) Mcg/act Aers (Albuterol sulfate) .... 2 puffs q 4 hours as needed wheeze. 2)  Hydrochlorothiazide 25 Mg Tabs (Hydrochlorothiazide) .Marland Kitchen.. 1 tab by mouth daily for blood pressure 3)  Ranitidine Hcl 150 Mg Caps (Ranitidine hcl) .... Take one tablet twice a day (reflux) 4)  Atenolol 50 Mg Tabs (Atenolol)  .... Take 2 tablets once a day 5)  Ibuprofen 800 Mg Tabs (Ibuprofen) .... Take 1 pill by mouth three times a day as needed for pain 6)  Tessalon 200 Mg Caps (Benzonatate) .Marland Kitchen.. 1 tablet by mouth three times a day as needed for cough 7)  Colace 100 Mg Caps (Docusate sodium) .... Take 1 tab by mouth twice a day 8)  Amlodipine Besylate 10 Mg Tabs (Amlodipine besylate) .... Take 1 tab by mouth daily  Patient Instructions: 1)  We will work on getting your blood pressure under better control 2)  I am going to start another medicine called Norvasc, start taking it once a day 3)  I am also going to increase your Atenolol to 100mg  per day, so take 2 of the 50mg  tabs each day 4)  I have also sent in a prescription for Colace this should help you with your constipation 5)  You also need to increase your water intake.  Try and drink 10 glasses a day 6)  I think that you should also start taking a fiber supplement (Metamucel, Citracel, or Benefiber).  Start taking these twice a day and they will help regulate your bowel movements in about 3-4 weeks Prescriptions: AMLODIPINE BESYLATE 10 MG TABS (AMLODIPINE BESYLATE) Take 1 tab by mouth daily  #30 x 3   Entered and Authorized by:   Angelena Sole MD   Signed by:   Angelena Sole MD on 05/10/2009   Method used:   Electronically to        [facility] [phone]* (retail)       [address]       [place], [place]  [address]       Ph: [phone]       Fax: [phone]   RxID:   [id number] ATENOLOL 50 MG TABS (ATENOLOL) take 2 tablets once a day  #60 x 3   Entered and Authorized by:   Angelena Sole MD   Signed by:   Angelena Sole MD on 05/10/2009   Method used:   Electronically to        [facility] [phone]* (retail)       [address]       [place], [place]  [address]       Ph: [phone]       Fax: [phone]   RxID:   [id number] COLACE 100 MG CAPS (DOCUSATE SODIUM) Take 1 tab by mouth twice a day  #60 x 3   Entered  and Authorized by:   Angelena Sole MD   Signed by:   Angelena Sole MD on 05/10/2009   Method used:   Electronically to        [facility] [phone]* (retail)       [address]       [place], [place]  [address]       Ph: [phone]       Fax: [phone]   RxID:   [id number]   Prevention & Chronic Care Immunizations   Influenza vaccine: Fluvax Non-MCR  (01/08/2009)   Influenza vaccine due: Not Indicated    Tetanus booster: 09/21/2007: Tdap   Tetanus booster due: 02/16/2006    Pneumococcal vaccine: Not documented  Other Screening   Pap smear: normal  (01/27/2008)   Pap smear due: 01/26/2009   Smoking status: current  (05/10/2009)   Smoking cessation counseling: YES  (06/27/2008)  Lipids   Total Cholesterol: Not documented   LDL: Not documented   LDL Direct: Not documented   HDL: Not documented   Triglycerides: Not documented  Hypertension   Last Blood Pressure: 164 / 100  (05/10/2009)   Serum creatinine: 0.81  (10/03/2008)   Serum potassium 3.6  (10/03/2008)    Hypertension flowsheet reviewed?: Yes   Progress toward BP goal: Unchanged  Self-Management Support :   Personal Goals (by the next clinic visit) :      Personal blood pressure goal: 140/90  (11/26/2008)  Hypertension self-management support: BP self-monitoring log, Written self-care plan, Education handout  (04/15/2009)

## 2010-03-20 NOTE — Assessment & Plan Note (Signed)
Summary: f/u eo   Vital Signs:  Patient profile:   40 year old female Weight:      242 pounds Temp:     98.5 degrees F oral Pulse rate:   81 / minute BP sitting:   148 / 96  (left arm) Cuff size:   large  Vitals Entered By: Arlyss Repress CMA, (March 14, 2009 3:49 PM) CC: f/up right ear pain and HTN. Is Patient Diabetic? No Pain Assessment Patient in pain? yes     Location: right ear Intensity: 5   Primary Care Provider:  Magnus Ivan MD  CC:  f/up right ear pain and HTN.Marland Kitchen  History of Present Illness: R ear pain: initially ear felt muffled, painful (8/10), now muffling has reduced and pain is worse (8-9/10 at times) but at visit was 5/10 started on the 5th came in saw Dr. Rexene Alberts and Tuesday prior to clinic visit pain got acutely worse ibuprofen/tylenol help for a while then pain comes back nothing really makes pain worse.  has awoken her out of her slseep R throat pain radiation, R lymph node swollen pt was having cold symptoms (congestion, fevers, etc) and ear pain at initial visit.  antibiotic:  azithromycin 500 mg x 3 days cleared up all symptom except ear pain. throat hurts also  htn chest pain-yes, from gerd (pt feels as if needs to burp, 4 burps relieve it, no radiation up jaw) headaches-yes, chronic, migraines vision changes-no sob-no swelling-yes, no more than normal compliant with medication-HCTZ, atenolol do bp readings at home? no  patient complained of constipation from meds.  advised her to buy OTC miralax or call back for a prescription if needed.   Habits & Providers  Alcohol-Tobacco-Diet     Tobacco Status: current     Tobacco Counseling: to quit use of tobacco products  Current Medications (verified): 1)  Proventil Hfa 108 (90 Base) Mcg/act Aers (Albuterol Sulfate) .... 2 Puffs Q 4 Hours As Needed Wheeze. 2)  Hydrochlorothiazide 25 Mg Tabs (Hydrochlorothiazide) .Marland Kitchen.. 1 Tab By Mouth Daily For Blood Pressure 3)  Ranitidine Hcl 150 Mg Caps  (Ranitidine Hcl) .... Take One Tablet Twice A Day (Reflux) 4)  Atenolol 50 Mg Tabs (Atenolol) .... Take 1 Tablet Once A Day 5)  Ibuprofen 800 Mg Tabs (Ibuprofen) .... Take 1 Pill By Mouth Three Times A Day As Needed For Pain 6)  Amoxicillin 500 Mg Caps (Amoxicillin) .... Take 2 Tablets Three Times A Day For 5 Days  Allergies: 1)  ! Ace Inhibitors 2)  ! * Cymbalta 3)  Flagyl (Metronidazole) 4)  Metoprolol Succinate  Review of Systems       as per hpi  Physical Exam  General:  NAD obese vital signs noted and wnl except blood pressure slightly elevated Ears:  L ear without erythema, TM and landmarks visualized well. R ear with erythema and fluid.  tympanogram shows that L ear is wnl and R ear with wide tympanogram. Lungs:  normal respiratory effort, normal breath sounds, no crackles, and no wheezes or ronchi  Heart:  normal rate, regular rhythm, no murmur, no gallop, and no rub.   Pulses:  R radial normal and L radial normal.  2+ Extremities:  1+ left pedal edema and 1+ right pedal edema.     Impression & Recommendations:  Problem # 1:  EAR PAIN, RIGHT (ICD-388.70) Assessment New  Likely a result of patient not treated with the best antibiotic for otitis media.  Will patient on high dose amoxicillin as  she has been treated with azithromycin in the last month.  Medication as noted below. The following medications were removed from the medication list:    Azithromycin 500 Mg Tabs (Azithromycin) ..... One by mouth daily for 3 days Her updated medication list for this problem includes:    Amoxicillin 500 Mg Caps (Amoxicillin) .Marland Kitchen... Take 2 tablets three times a day for 5 days  Orders: [facility]- Est Level  3 (16109)  Problem # 2:  HYPERTENSION (ICD-401.9) Assessment: Improved  Pt's blood pressure has been improving.  Would like to see it come down to 120/80's but am pleased that it is consistently coming down.  Increased patient's atenolol to 50 mg to see if that will help bring down  bp.   The following medications were removed from the medication list:    Amlodipine Besylate 10 Mg Tabs (Amlodipine besylate) .Marland Kitchen... Take one tablet daily Her updated medication list for this problem includes:    Hydrochlorothiazide 25 Mg Tabs (Hydrochlorothiazide) .Marland Kitchen... 1 tab by mouth daily for blood pressure    Atenolol 50 Mg Tabs (Atenolol) .Marland Kitchen... Take 1 tablet once a day  Orders: [facility]- Est Level  3 (99213)  Complete Medication List: 1)  Proventil Hfa 108 (90 Base) Mcg/act Aers (Albuterol sulfate) .... 2 puffs q 4 hours as needed wheeze. 2)  Hydrochlorothiazide 25 Mg Tabs (Hydrochlorothiazide) .Marland Kitchen.. 1 tab by mouth daily for blood pressure 3)  Ranitidine Hcl 150 Mg Caps (Ranitidine hcl) .... Take one tablet twice a day (reflux) 4)  Atenolol 50 Mg Tabs (Atenolol) .... Take 1 tablet once a day 5)  Ibuprofen 800 Mg Tabs (Ibuprofen) .... Take 1 pill by mouth three times a day as needed for pain 6)  Amoxicillin 500 Mg Caps (Amoxicillin) .... Take 2 tablets three times a day for 5 days  Patient Instructions: 1)  I'm sorry that your infection didn't get cleared up.  I'm giving you amoxicillin for it. 2)  If you are having an allergic reaction to the amoxicillin or increase in atenolol or are feeling worse please come back. 3)  Follow up in a month to check your blood pressure.  Since we are increasing your atenolol, watch out for lightheadedness or dizziness especially as side effects then come back earlier. 4)  Follow up in 1 month. 5)  Thank you and be blessed! Prescriptions: AMOXICILLIN 500 MG CAPS (AMOXICILLIN) take 2 tablets three times a day for 5 days  #30 x 0   Entered and Authorized by:   Magnus Ivan MD   Signed by:   Magnus Ivan MD on 03/14/2009   Method used:   Electronically to        [facility] [phone]* (retail)       [address]       [place], [place]  [address]       Ph: [phone]       Fax: [phone]   RxID:   [id number] ATENOLOL 50 MG TABS  (ATENOLOL) take 1 tablet once a day  #30 x 1   Entered and Authorized by:   Magnus Ivan MD   Signed by:   Magnus Ivan MD on 03/14/2009   Method used:   Electronically to        [facility] [phone]* (retail)       [address]       [place], [place]  [address]       Ph: [phone]       Fax: [phone]  RxID:   [id number] PROVENTIL HFA 108 (90 BASE) MCG/ACT AERS (ALBUTEROL SULFATE) 2 puffs q 4 hours as needed wheeze.  #1 x 1   Entered and Authorized by:   Magnus Ivan MD   Signed by:   Magnus Ivan MD on 03/14/2009   Method used:   Electronically to        [facility] [phone]* (retail)       [address]       [place], [place]  [address]       Ph: [phone]       Fax: [phone]   RxID:   [id number]

## 2010-03-20 NOTE — Progress Notes (Signed)
Summary: triage  Phone Note Call from Patient Call back at Home Phone [phone]   Caller: Patient Summary of Call: Pt has cold with shortness of breath and wondering if they can be seen today? Initial call taken by: Clydell Hakim,  April 15, 2009 12:13 PM  Follow-up for Phone Call        hx asthma. appt at 1:30 with Dr. Burnadette Pop Follow-up by: Golden Circle RN,  April 15, 2009 12:25 PM

## 2010-03-20 NOTE — Assessment & Plan Note (Signed)
Summary: f/u bp/eo   Vital Signs:  Patient profile:   40 year old female Height:      63 inches Weight:      237 pounds BMI:     42.13 BSA:     2.08 Temp:     98.1 degrees F Pulse rate:   84 / minute BP sitting:   131 / 99  Vitals Entered By: Jone Baseman CMA (May 21, 2009 2:08 PM) CC: F/U BP Is Patient Diabetic? No Pain Assessment Patient in pain? no        Primary Care Provider:  Magnus Ivan MD  CC:  F/U BP.  History of Present Illness: 1.  HTN:  Pt has been taking her HCTZ and Atenolol as prescribed.  She did not have the money to go get the Norvasc.  She didn't take her blood pressure today.  She doesn't check her blood pressure at home regularly.  Accompanied by her son today, asking what sort of things that he can do to help.      ROS:  denies chest pain, shortness of breath, headache      SocHx:  Doesn't eat extra salt, doesn't exercise  Habits & Providers  Alcohol-Tobacco-Diet     Tobacco Status: current     Tobacco Counseling: to quit use of tobacco products     Cigarette Packs/Day: 1.0  Allergies: 1)  ! Ace Inhibitors 2)  ! * Cymbalta 3)  Flagyl (Metronidazole) 4)  Metoprolol Succinate  Past History:  Past Medical History: Reviewed history from 09/27/2008 and no changes required. CPAP 14 cwp -2005, Lunesta 2mg  po qhs to sleep w/CPAP, no seizures since 40yo., seizures age [age]yo, tx`d w/phenobarb-no tx and, Took Maxalt for migraines- was prescribed by neuro htn  Social History: Reviewed history from 04/15/2006 and no changes required. Lives in [place] w/10yo daughter, 12yo son, mother.  Married 2005. Clericalt job in Clinical biochemist.   Hobbied-reading, TV, movies.  Smokes 1ppd for anxiety, no ETOH/drugs.; Packs/Day:  1.0  Physical Exam  General:  VS Reviewed and Rechecked. Well appearing, NAD.  Head:  normocephalic and atraumatic.   Eyes:  vision grossly intact, pupils equal, pupils round, and pupils reactive to light.  Fundoscopic exam  benign Neck:  supple, full ROM, no goiter or mass  Lungs:  normal WOB, CTAB Heart:  Normal rate and regular rhythm. S1 and S2 normal without gallop, murmur, click, rub or other extra sounds. Extremities:  no lower extremity edema   Impression & Recommendations:  Problem # 1:  HYPERTENSION (ICD-401.9) Assessment Unchanged  Advised her to continue taking the medicines as prescribed and fill the Amlodipine when she gets her paycheck (Friday).  Spent 15 minutes with her and her son talking about lifestyle modification including smoking cessation, limiting salt intake, exercise, and stress. Her updated medication list for this problem includes:    Hydrochlorothiazide 25 Mg Tabs (Hydrochlorothiazide) .Marland Kitchen... 1 tab by mouth daily for blood pressure    Atenolol 50 Mg Tabs (Atenolol) .Marland Kitchen... Take 2 tablets once a day    Amlodipine Besylate 10 Mg Tabs (Amlodipine besylate) .Marland Kitchen... Take 1 tab by mouth daily  Orders: [facility]- Est Level  3 (99213)  Complete Medication List: 1)  Proventil Hfa 108 (90 Base) Mcg/act Aers (Albuterol sulfate) .... 2 puffs q 4 hours as needed wheeze. 2)  Hydrochlorothiazide 25 Mg Tabs (Hydrochlorothiazide) .Marland Kitchen.. 1 tab by mouth daily for blood pressure 3)  Ranitidine Hcl 150 Mg Caps (Ranitidine hcl) .... Take one tablet twice  a day (reflux) 4)  Atenolol 50 Mg Tabs (Atenolol) .... Take 2 tablets once a day 5)  Ibuprofen 800 Mg Tabs (Ibuprofen) .... Take 1 pill by mouth three times a day as needed for pain 6)  Tessalon 200 Mg Caps (Benzonatate) .Marland Kitchen.. 1 tablet by mouth three times a day as needed for cough 7)  Colace 100 Mg Caps (Docusate sodium) .... Take 1 tab by mouth twice a day 8)  Amlodipine Besylate 10 Mg Tabs (Amlodipine besylate) .... Take 1 tab by mouth daily  Patient Instructions: 1)  I think that your blood pressure is better 2)  I would still like to see the numbers a little lower 3)  When you get paid I would recommend filling the new medicine 4)  Please schedule a  follow up appointment in 4-6 weeks  Prevention & Chronic Care Immunizations   Influenza vaccine: Fluvax Non-MCR  (01/08/2009)   Influenza vaccine due: Not Indicated    Tetanus booster: 09/21/2007: Tdap   Tetanus booster due: 02/16/2006    Pneumococcal vaccine: Not documented  Other Screening   Pap smear: normal  (01/27/2008)   Pap smear due: 01/26/2009   Smoking status: current  (05/21/2009)   Smoking cessation counseling: YES  (06/27/2008)  Lipids   Total Cholesterol: Not documented   LDL: Not documented   LDL Direct: Not documented   HDL: Not documented   Triglycerides: Not documented  Hypertension   Last Blood Pressure: 131 / 99  (05/21/2009)   Serum creatinine: 0.81  (10/03/2008)   Serum potassium 3.6  (10/03/2008)    Hypertension flowsheet reviewed?: Yes   Progress toward BP goal: Improved  Self-Management Support :   Personal Goals (by the next clinic visit) :      Personal blood pressure goal: 140/90  (11/26/2008)   Hypertension self-management support: BP self-monitoring log, Written self-care plan, Education handout  (04/15/2009)

## 2010-03-20 NOTE — Miscellaneous (Signed)
Summary: high bps  Clinical Lists Changes Jane with [facility] [phone])called to report that pt was seen there today. her bps were 185/113, 184/123, 170/118. pt had not yet taken her HCTZ or atenolol. I called the pt & she will be here at 1:30 to see Dr. Lelon Perla. I called [facility] back & thanked them fior the info & that we have her on the schedule. Marland KitchenGolden Circle RN  May 10, 2009 11:25 AM

## 2010-05-04 LAB — COMPREHENSIVE METABOLIC PANEL
ALT: 41 U/L — ABNORMAL HIGH (ref 0–35)
AST: 33 U/L (ref 0–37)
Albumin: 4 g/dL (ref 3.5–5.2)
Alkaline Phosphatase: 61 U/L (ref 39–117)
Chloride: 105 mEq/L (ref 96–112)
Glucose, Bld: 87 mg/dL (ref 70–99)
Sodium: 136 mEq/L (ref 135–145)

## 2010-05-04 LAB — CBC
HCT: 48.5 % — ABNORMAL HIGH (ref 36.0–46.0)
Hemoglobin: 16.7 g/dL — ABNORMAL HIGH (ref 12.0–15.0)
MCHC: 34.5 g/dL (ref 30.0–36.0)
MCV: 93.7 fL (ref 78.0–100.0)
Platelets: 93 10*3/uL — ABNORMAL LOW (ref 150–400)
RBC: 5.18 MIL/uL — ABNORMAL HIGH (ref 3.87–5.11)
WBC: 6 10*3/uL (ref 4.0–10.5)

## 2010-05-04 LAB — URINALYSIS, ROUTINE W REFLEX MICROSCOPIC: Protein, ur: NEGATIVE mg/dL

## 2010-05-04 LAB — URINE MICROSCOPIC-ADD ON

## 2010-05-28 LAB — POCT I-STAT, CHEM 8
Creatinine, Ser: 0.8 mg/dL (ref 0.4–1.2)
Glucose, Bld: 112 mg/dL — ABNORMAL HIGH (ref 70–99)
HCT: 49 % — ABNORMAL HIGH (ref 36.0–46.0)
Hemoglobin: 16.7 g/dL — ABNORMAL HIGH (ref 12.0–15.0)
Potassium: 3.4 mEq/L — ABNORMAL LOW (ref 3.5–5.1)
TCO2: 27 mmol/L (ref 0–100)

## 2010-05-28 LAB — URINALYSIS, ROUTINE W REFLEX MICROSCOPIC
Bilirubin Urine: NEGATIVE
Ketones, ur: NEGATIVE mg/dL
Nitrite: NEGATIVE
Urobilinogen, UA: 1 mg/dL (ref 0.0–1.0)

## 2010-07-04 NOTE — Consult Note (Signed)
Alicia Mosley, Alicia Mosley             ACCOUNT NO.:  [id number]   MEDICAL RECORD NO.:  [id number]          PATIENT TYPE:  INP   LOCATION:  9305                          FACILITY:  [facility]   PHYSICIAN:  Lauretta I. Odogwu, M.D.DATE OF BIRTH:  03-Mar-1970   DATE OF CONSULTATION:  02/14/2004  DATE OF DISCHARGE:                                   CONSULTATION   REASON FOR CONSULT:  Thrombocytopenia.   REFERRING M.D.:  Roseanna Rainbow, M.D.   HISTORY OF PRESENT ILLNESS:  Alicia Mosley is a pleasant 40 year old, African-  American female, asked to see in consultation for evaluation of  thrombocytopenia.  She has known history of low platelet count, at least  since her previous admission in July 2005, believed to be secondary to heavy  vaginal bleeding due to fibroids.  Platelet count at the time was about  89,000.  She then was instructed to follow up her low platelet count as an  outpatient upon discharge.  She did follow up at the [facility], although records are not available at the time of  evaluation.  Apparently, her numbers dropped to about 60,000 on this  admission.  She returns with uterine bleeding, pain, for which Dr. Jean RosenthalChristell Constant admitted her in order to manage her condition.  She denies any gum  bleeding, rhinorrhea, or GI bleed.  She does have vaginal bleeding, filling  up about 12 pads a day.  All labs at the time of consultation are pending.   PAST MEDICAL HISTORY:  1.  Thrombocytopenia.  2.  Iron deficiency anemia, on iron supplement.  3.  History of tobacco habituation.  4.  Sleep apnea, on CPAP.  5.  Asthma.  6.  History of uterine fibroids.  7.  Migraine.   SURGERIES/PROCEDURES:  Status post bilateral tubal ligation 12 years ago.   ALLERGIES:  NKDA.   CURRENT MEDICATIONS:  1.  Premarin 2.5 mg p.o. q.i.d.  2.  Provera 10 mg p.o. daily.  3.  Ambien p.r.n.  4.  Albuterol p.r.n.  5.  Fluoxetine 20 mg daily.  6.  Ferrous sulfate one p.o.  daily.   REVIEW OF SYSTEMS:  Essentially negative in all 15 points, with the  exception of some intermittent nausea, and  as described by her, hot  flashes, as well as vaginal bleeding since February 11, 2004.   FAMILY HISTORY:  Mother and father are both alive and well.   GYN HISTORY:  Last menstrual period believed to be on February 11, 2004.  She is G4 P2 A2.   SOCIAL HISTORY:  The patient is married.  She has two children in good  health.  She is a Occupational psychologist.  She smokes about one  pack a day of cigarettes for the last 10 years.  Denies any alcohol intake  or recreational drug use.  She lives in [place].  She is Control and instrumentation engineer.   PHYSICAL EXAMINATION:  GENERAL:  This is a 40 year old, African-American  female in no acute distress.  Alert and oriented x 3.  BLOOD PRESSURE:  140/90.  PULSE:  86.  RESPIRATIONS:  20.  TEMPERATURE:  97.  WEIGHT:  226 pounds.  PULSE OXIMETRY:  97% on 2 liters.  HEENT:  Normocephalic, atraumatic.  PERRLA.  Oral mucosa without lesions or  gum bleeding.  NECK:  Supple.  No JVD, no cervical or supraclavicular masses.  CHEST:  Symmetrical on inspiration.  LUNGS:  Clear to auscultation bilaterally.  No axillary masses.  BREASTS:  Not examined.  CARDIOVASCULAR:  Regular rate and rhythm, without murmurs, rubs, or gallops.  ABDOMEN:  Moderately obese, nontender.  Bowel sounds x 4.  No palpable  spleen or liver.  GU/RECTAL:  Deferred.  EXTREMITIES:  Without no clubbing or cyanosis, no edema.  SKIN:  Without petechiae or purpura.  NEUROLOGIC:  Nonfocal.   LABORATORIES:  All the labs are currently pending.  This includes HIV test,  anticardiolipin antibody, antiplatelet antibody, PT, PTT, INR, CBC with  differential, and CMET.   ASSESSMENT AND PLAN:  1.  This is a 40 year old female with a history of thrombocytopenia at least      dating back to July 2005 in the setting of admission secondary to      significant vaginal bleeding, in a  patient with a history of fibroids.      It is possible that this patient has ITP, with a platelet count ranging      from the 60,000-80,000s.  However, other causes need to be ruled out.      We agree with ordering anticardiolipin antibody, in order to rule out      lupus-like syndrome.  Agree with the coagulation studies.  Will add a      DIC panel, in a purple top tube.  Will also check her peripheral smear.  2.  As for her dysfunction regarding her uterine bleeding in the setting of      low platelet count, transfuse if the platelet count drops to less than      20,000.  If surgery is required, the platelet count should be at least      greater than 75,000.  We will be glad to follow up with you, this nice      patient.  Thank you very much for allowing [place] the opportunity to      participate in her care.      ________________________________________  Marlowe Kays, P.A.  ___________________________________________Lauretta I. Odogwu, M.D.    SW/MEDQ  D:  02/14/2004  T:  02/14/2004  Job:  [id number]

## 2010-07-04 NOTE — Discharge Summary (Signed)
Alicia Mosley, STANBACK NO.:  [id number]   MEDICAL RECORD NO.:  [id number]          PATIENT TYPE:  INP   LOCATION:  9310                          [facility]ILITY:  [facility]   PHYSICIAN:  Roseanna Rainbow, M.D.DATE OF BIRTH:  April 21, 1970   DATE OF ADMISSION:  03/06/2004  DATE OF DISCHARGE:  03/08/2004                                 DISCHARGE SUMMARY   CHIEF COMPLAINT:  The patient is a 40 year old African-American female with  a small myomatous uterus with secondary dysmenorrhea and menometrorrhagia  refractory to attempts at medical management, who now presents for a total  vaginal hysterectomy and diagnostic laparoscopy, possible total abdominal  hysterectomy.  Please see the dictated history and physical for further  details.   HOSPITAL COURSE:  The patient was admitted and underwent a diagnostic  laparoscopy and total vaginal hysterectomy.  Please see the dictated  operative summary for further details.  The patient's postoperative course  was uneventful.  She was discharged to home on postoperative day #2.   DISCHARGE DIAGNOSES:  1.  Adenomyosis.  2.  Uterine fibroids.   PROCEDURE:  Diagnostic laparoscopy and total vaginal hysterectomy.   CONDITION ON DISCHARGE:  Good.   DISCHARGE MEDICATIONS:  Tylox.   ACTIVITY:  No strenuous activity, pelvic rest.   DISPOSITION:  The patient was to follow up in the office in six weeks.      LAJ/MEDQ  D:  04/08/2004  T:  04/09/2004  Job:  [id number]

## 2010-07-04 NOTE — H&P (Signed)
NAMETIMMY, CLEVERLY             ACCOUNT NO.:  [id number]   MEDICAL RECORD NO.:  [id number]          PATIENT TYPE:  INP   LOCATION:  NA                            [facility]   PHYSICIAN:  Roseanna Rainbow, M.D.DATE OF BIRTH:  10/16/1970   DATE OF ADMISSION:  DATE OF DISCHARGE:                                HISTORY & PHYSICAL   CHIEF COMPLAINT:  The patient is a 40 year old African American female with  a small myomatous uterus with secondary dysmenorrhea and menometrorrhagia  refractory to attempts at medical management who now presents for a vaginal  hysterectomy and diagnostic laparoscopy, possible total abdominal  hysterectomy.   HISTORY OF PRESENT ILLNESS:  Please see the above.  The patient has a  several month history of dysmenorrhea and menometrorrhagia.  She has had  attempts at medical management with Depo-Provera.  Workup to date has  included a pelvic ultrasound that demonstrated a small myomatous uterus, a  normal TSH, normal hemoglobin.  Of note, she was found to have  thrombocytopenia with platelets in the 60,000 to 80,000 range.   PAST OBSTETRICAL/GYNECOLOGICAL HISTORY:  1.  She is sexually active.  2.  Menstrual periods are described as irregular, severe dysmenorrhea.  3.  Last Pap smear normal.  4.  She is status post bilateral tubal ligation.  5.  She has been pregnant four times.  6.  She has two living children.  7.  There is a history of two miscarriages or abortions.   PAST MEDICAL HISTORY:  1.  Migraine headaches.  2.  Asthma.  3.  Anxiety disorder.  4.  Sleep apnea requiring a CPAP machine.  5.  Allergic rhinitis.  6.  Remote history of epilepsy.   SOCIAL HISTORY:  She is married, living with her spouse.  She is employed as  a Occupational psychologist.  She currently smokes one pack per day.  She does not give any significant history of alcohol usage.  She denies any  illicit drug usage.   FAMILY HISTORY:  Positive for  hypertension, adult onset diabetes, myocardial  infarction, cerebrovascular accident.   ALLERGIES:  No known drug allergies.   MEDICATIONS:  Albuterol, Ambien, Flovent, Zyrtec, Flonase, and Singulair.   REVIEW OF SYSTEMS:  GU:  See the HPI.   PHYSICAL EXAMINATION:  VITAL SIGNS:  Temperature 97.8, pulse 80, blood  pressure 149/99, weight 228 pounds, height 5 foot 2 inches.  GENERAL/CONSTITUTIONAL:  [place] female appears greater than stated  age, overweight body habitus.  LUNGS:  Clear to auscultation bilaterally.  HEART:  Regular rate and rhythm.  ABDOMEN:  Soft, nontender without masses.  PELVIC:  External female genitalia normal EGBUS.  On speculum exam, the  vagina is clean.  On bimanual exam, the uterus is nontender of normal size,  shape, and consistency.  Position and mobility are normal.  Adnexa with no  organomegaly or local guarding.   ASSESSMENT:  1.  Leiomyomatous uterus with secondary menometrorrhagia.  2.  Dysmenorrhea.   PLAN:  The planned procedure is diagnostic laparoscopy and total vaginal  hysterectomy, possible total abdominal hysterectomy.  The  risks, benefits,  and alternatives forms of management were reviewed with the patient and  informed consent has been obtained.     Collier Flowers  D:  02/23/2004  T:  02/23/2004  Job:  [id number]

## 2010-07-04 NOTE — Op Note (Signed)
Alicia Mosley, Alicia Mosley NO.:  [id number]   MEDICAL RECORD NO.:  [id number]          PATIENT TYPE:  INP   LOCATION:  9310                          [facility]ILITY:  [facility]   PHYSICIAN:  Roseanna Rainbow, M.D.DATE OF BIRTH:  [birth year]   DATE OF PROCEDURE:  03/06/2004  DATE OF DISCHARGE:                                 OPERATIVE REPORT   PREOPERATIVE DIAGNOSIS:  Uterine fibroids, menometrorrhagia, dysmenorrhea.   POSTOPERATIVE DIAGNOSIS:  Uterine fibroids, menometrorrhagia, dysmenorrhea.   PROCEDURE:  Diagnostic laparoscopy, total vaginal hysterectomy.   SURGEON:  Roseanna Rainbow, M.D.   ANESTHESIA:  General tracheal.   COMPLICATIONS:  None.   ESTIMATED BLOOD LOSS:  150 cc.   URINE OUTPUT:  100 cc clear urine at the end of the procedure.   FINDINGS:  At diagnostic laparoscopy,  the pelvic anatomy appeared normal.  There were no lesions consistent with endometriotic implants noted or  adhesive disease.   PROCEDURES:  The risks, benefits, indications and alternatives of the  procedure were reviewed with the patient and informed consent was obtained.  She was taken to the operating room with an IV running. The patient was then  placed in the dorsal lithotomy position, given general anesthesia and  prepped and draped in the usual sterile fashion. The patient was examined  under anesthesia and found to have an upper limits of normal size anteverted  uterus with normal adnexa. A bivalve speculum was placed in the patient's  vagina. The anterior lip of the cervix was grasped with a single-tooth  tenaculum. A Hulka manipulator was then advanced into the uterus and secured  to the anterior lip of the cervix as a means manipulate the uterus. The  speculum was removed from the vagina. Attention was then turned to the  patient's abdomen where 10 mm skin incision was made in the umbilical fold.  The Veress needle was then introduced into the peritoneal cavity while  tenting up the abdominal wall at a 45 degree angle.  Intraabdominal  placement was confirmed by a low CO2 pressure level reading as well as a  water drop test. A pneumoperitoneum was obtained with 3 1/2 liters of CO2  gas. The trocar and sleeve were then advanced without difficulty into the  abdomen where intra-abdominal placement was again confirmed by the  laparoscope. A survey of the patient's pelvis and abdomen revealed normal  anatomy. At this point attention was turned to the vaginal portion of the  procedure. A weighted speculum was placed. A weighted speculum placed in the  vagina. The cervix was grasped with a Christella Hartigan tenaculum. The cervix was  injected circumferentially with 1% lidocaine with 1:200,000 of epinephrine.  The cervix was circumferentially incised with the scalpel. The bladder was  dissected off the pubovesical fascia anteriorly with Metzenbaum scissors.  The posterior cul-de-sac was entered sharply without difficulty. At this  point,  parametrial clamps were placed over the uterosacral ligaments on  either side. These were then transected and suture ligated with #0 Vicryl.  Excellent hemostasis was visualized. The cardinal ligaments were clamped on  both sides, transected and suture ligated  in a similar fashion. At this  point,  the anterior cul-de-sac was entered sharply. The uterine arteries  and the broad ligament were serially clamped with parametrial clamps and  transected and suture ligated on both sides. Excellent hemostasis was noted.  Both cornu were clamped with parametrial clamps, transected and the uterus  delivered. These pedicles were then secured with free ligatures and suture  ligatures. Excellent hemostasis was noted. The parietal peritoneum was  closed with a pursestring string suture of 2-0 Vicryl. The vaginal cuff  angles were closed with figure-of-eight sutures of #0 Vicryl. The the  uterosacral ligaments were plicated in the midline, the remainder  of the  vaginal cuff was reapproximated with figure-of-eight sutures of #0 Vicryl in  an interrupted fashion. At this point,  attention again was then turned to  the abdomen. A pneumoperitoneum was then reobtained with CO2 gas. A survey  of the patient's pelvis revealed adequate hemostasis of the pedicles. All  the instruments were then removed. The patient was taken out of dorsal  lithotomy position and awakened from general anesthesia. She was taken to  the PACU in stable condition. Sponge, lap and needle and instrument counts  were correct x2.   PATHOLOGY:  Uterus and cervix.      LAJ/MEDQ  D:  03/06/2004  T:  03/06/2004  Job:  [id number]

## 2010-07-04 NOTE — H&P (Signed)
Alicia Mosley, AMENT NO.:  [id number]   MEDICAL RECORD NO.:  [id number]           PATIENT TYPE:   LOCATION:                                 FACILITY:   PHYSICIAN:  Roseanna Rainbow, M.D. DATE OF BIRTH:   DATE OF ADMISSION:  DATE OF DISCHARGE:                                HISTORY & PHYSICAL   DATE OF ADMISSION:  February 14, 2004   CHIEF COMPLAINT:  The patient is a 40 year old African-American female with  a small myomatous uterus, thrombocytopenia, with likely progestin break-  through bleeding after Depo-Provera who presents with heavy vaginal  bleeding.   HISTORY OF PRESENT ILLNESS:  Please see the above.  The patient was seen at  the [facility] who had previously been diagnosed with  uterine fibroids.  A pelvic ultrasound in July 2005 demonstrated a uterus  that was 9.2 cm in sagittal diameter with a 2 cm intramural fibroid with a  partial submucosal component.  She was initially started on Depo-Provera to  control the abnormal bleeding and secondary dysmenorrhea.  Since receiving  the Depo-Provera, she has had several episodes of heavy bleeding that have  been controlled with COCPs and Premarin.  She has now been bleeding for  several weeks and now is reporting passing of large blood clots.  Of note,  she was noted to be thrombocytopenic with a platelet count of 89,000.  A  most recent CBC on January 22, 2004 demonstrated a platelet count of 64,000  and the platelet count was confirmed on a smear.  She was also HIV  nonreactive at that time.  GC and chlamydia probes from December 6 were  negative as well.   PAST OBSTETRICAL AND GYNECOLOGICAL HISTORY:  She has been pregnant four  times.  She has had two live births, one miscarriage, and one abortion.  She  is also status post a bilateral tubal ligation.   PAST MEDICAL HISTORY:  1.  Seizure disorder as a child treated with phenobarbital until an      adolescent.  2.   Migraine headaches.   MEDICATIONS:  Ibuprofen p.r.n. migraines.   ALLERGIES:  No known drug allergies.   FAMILY HISTORY:  High blood pressure, diabetes, myocardial infarction, CVA.  She smokes one pack per day.  She denies any alcohol or street drugs.   REVIEW OF SYSTEMS:  GENERAL:  She denies any lightheadedness.  GU:  See  above.   PHYSICAL EXAMINATION:  VITAL SIGNS:  Blood pressure 147/93, pulse 79,  temperature 97.9, weight 225 pounds.  GENERAL:  Overweight African-American female in no apparent distress.  ABDOMEN:  Nontender, no organomegaly.  PELVIC:  On speculum exam, there is large heme.  Bimanual exam:  The uterus  is upper limits of normal size, anteverted, nontender.  The adnexa are  nonpalpable, nontender.   ASSESSMENT:  Abnormal uterine bleeding in the context of a small myomatous  uterus and thrombocytopenia.  The thrombocytopenia has not been worked up.   PLAN:  Plan observation in the hospital with repeat platelet count, high-  dose Premarin, and consultation with  hematology.     Collier Flowers  D:  02/14/2004  T:  02/14/2004  Job:  [id number]

## 2010-07-06 IMAGING — US US TRANSVAGINAL NON-OB
1 series · 13 of 25 positions shown · non-contrast
Comparison: CT 10/03/2007

CLINICAL DATA: Reevaluate ovarian cyst seen on prior CT in September 2007.

TRANSABDOMINAL AND TRANSVAGINAL ULTRASOUND OF PELVIS
TECHNIQUE: Both transabdominal and transvaginal ultrasound
examinations of the pelvis were performed including evaluation of
the uterus, ovaries, adnexal regions, and pelvic cul-de-sac.

[Series 1: us transvaginal non-ob · 0.35mm/px · 36 acquisitions, 13 frames shown]
[im 1/36]
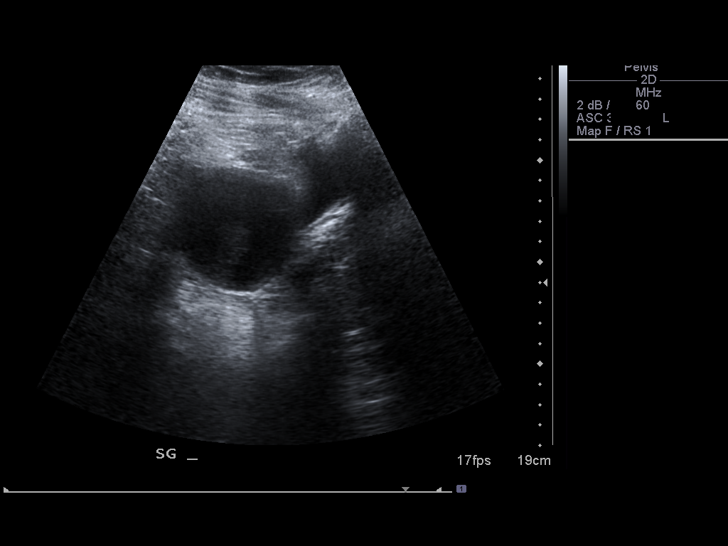
[im 3/36]
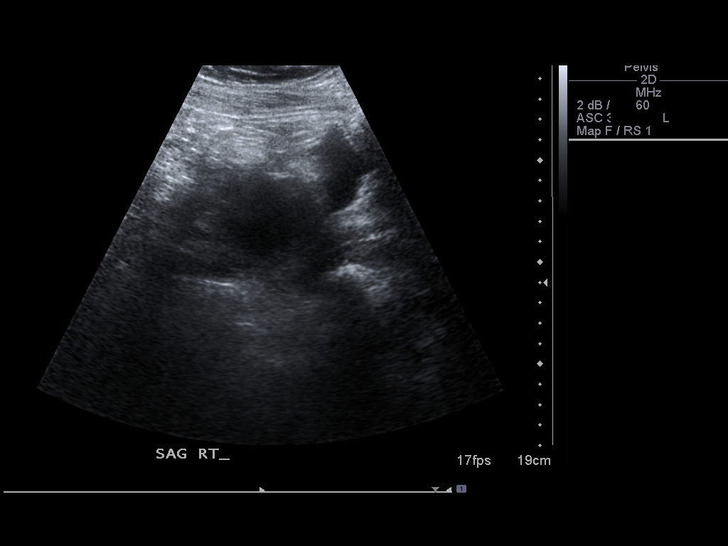
[im 6/36]
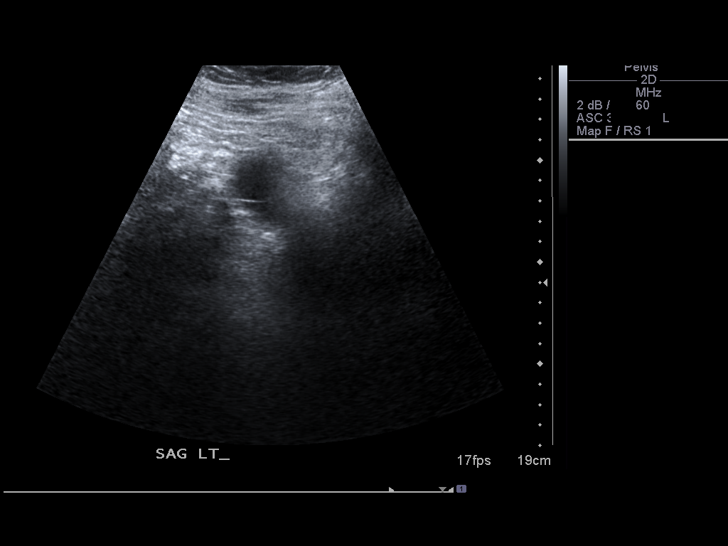
[im 9/36]
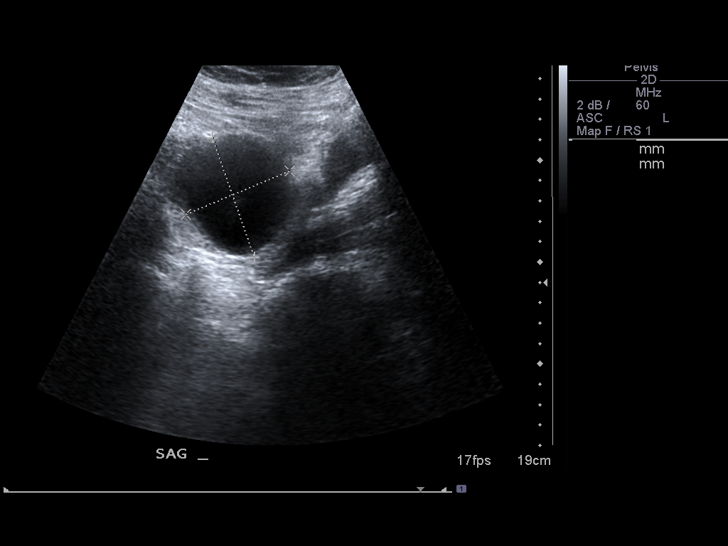
[im 12/36]
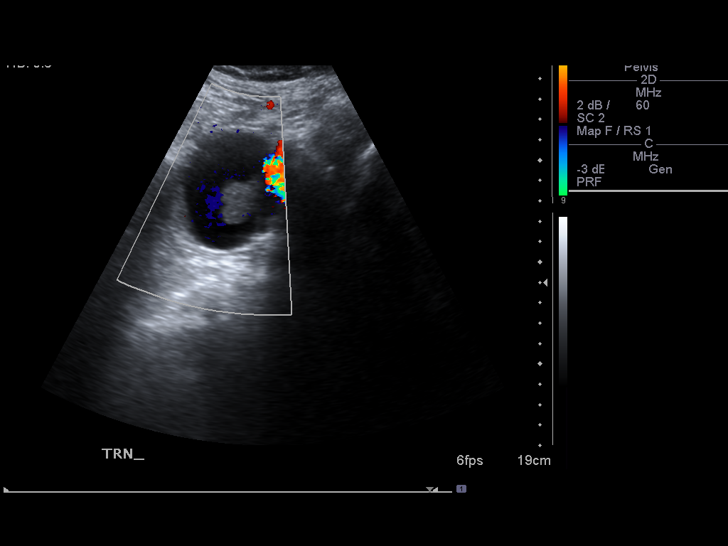
[im 15/36]
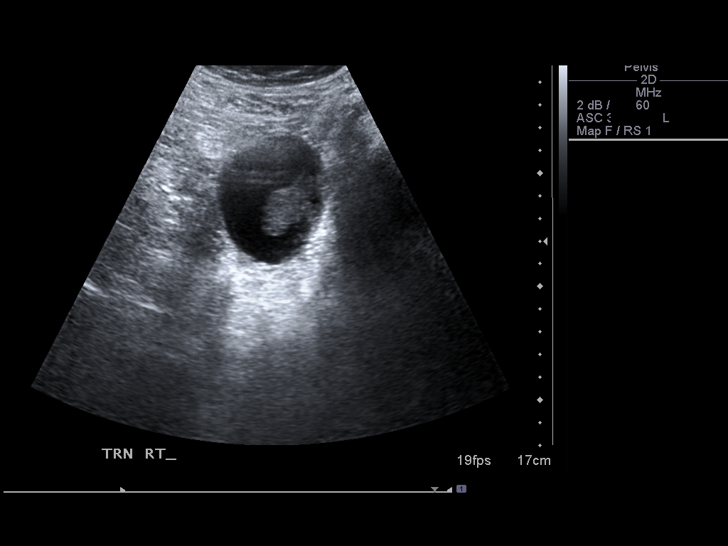
[im 18/36]
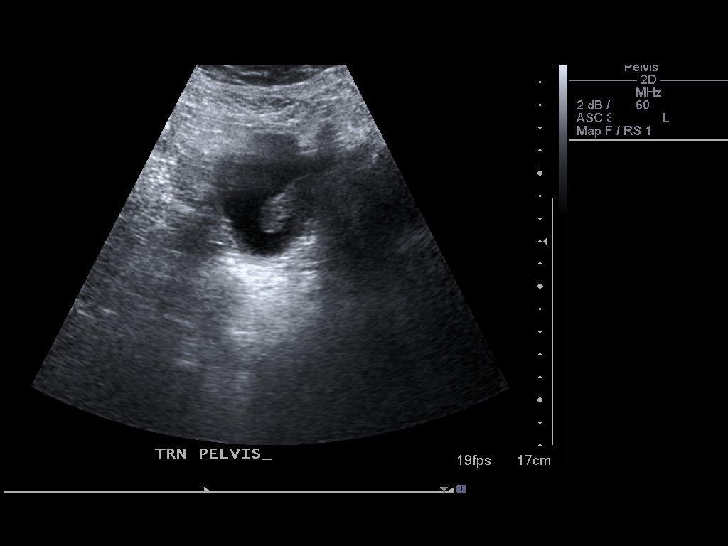
[im 21/36]
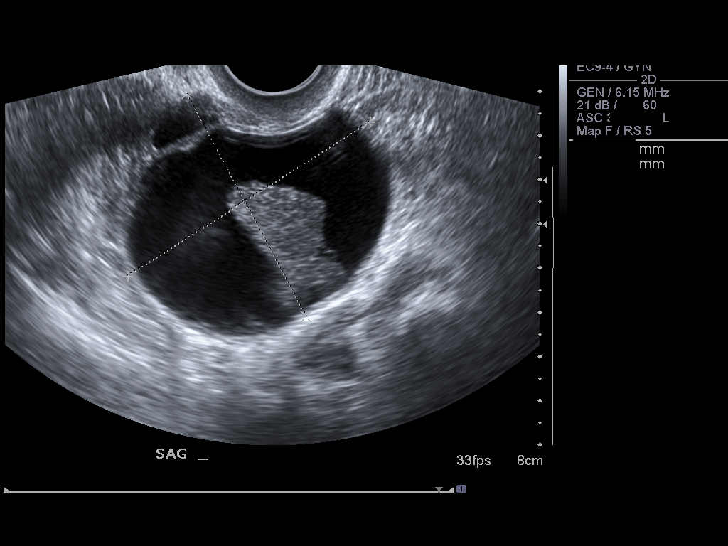
[im 24/36]
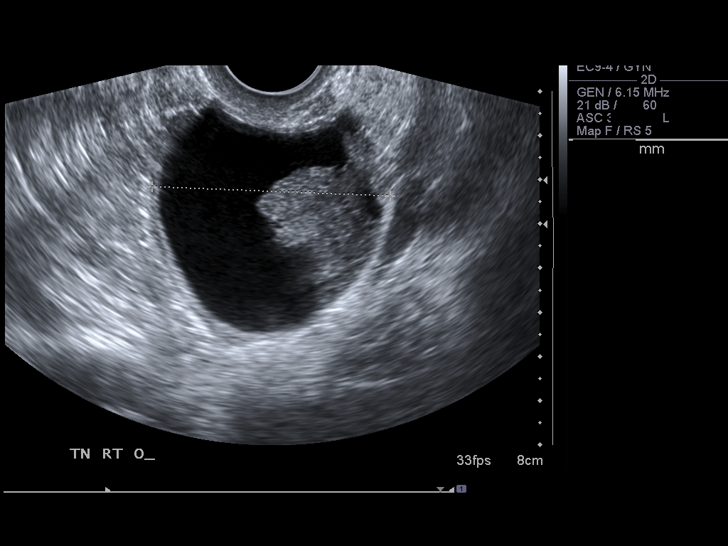
[im 27/36]
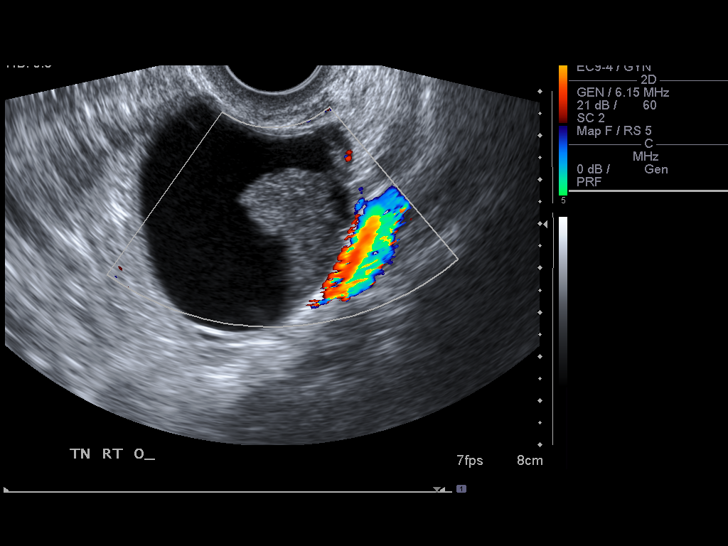
[im 30/36]
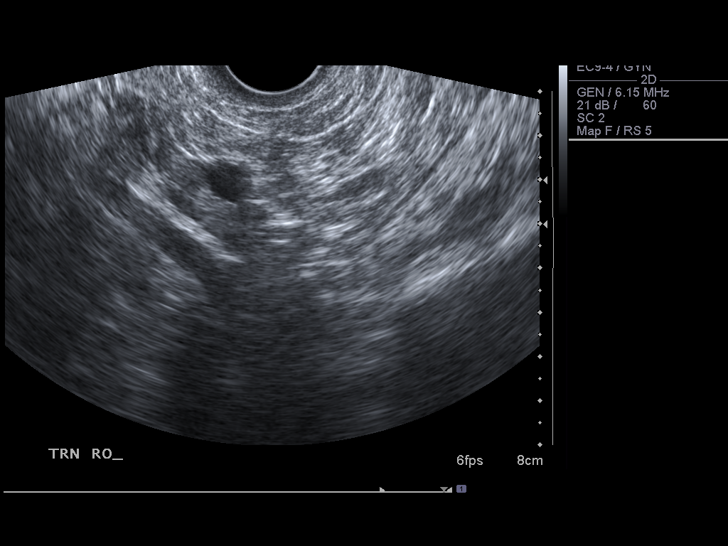
[im 33/36]
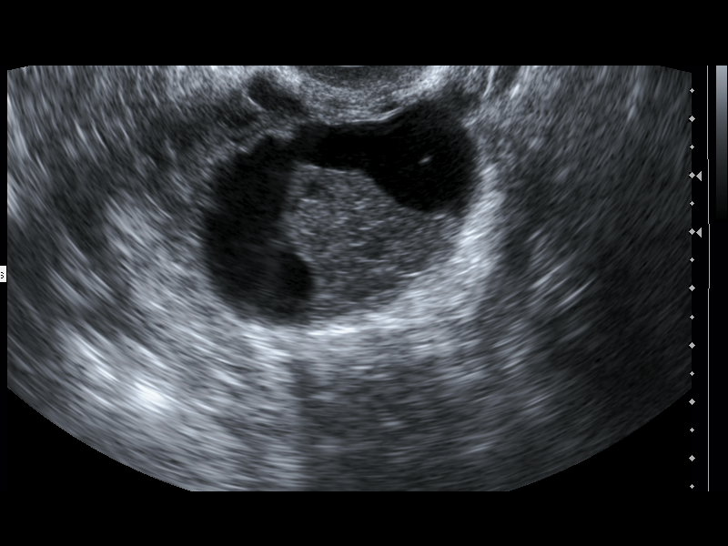
[im 36/36]
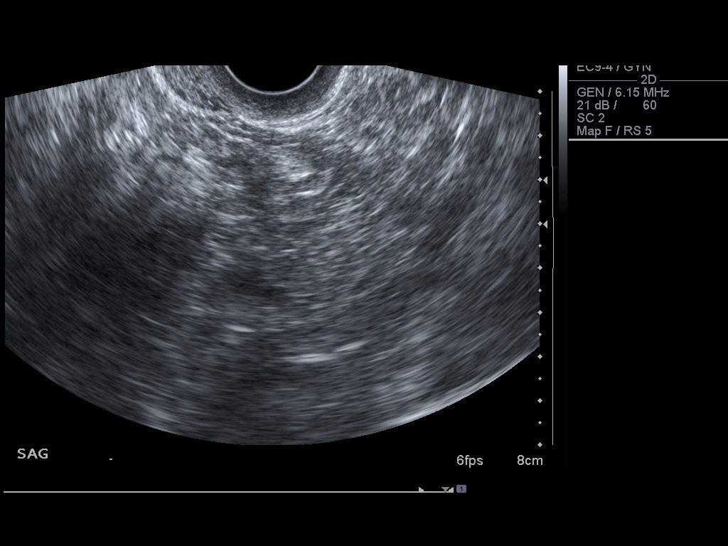

[13 of 25 positions shown; findings below may reference images not displayed]

FINDINGS: The patient is status post hysterectomy and a normal
vaginal cuff is seen.

The right ovary has a normal appearance measuring 2.7 x 1.6 x
cm.  The left ovary is enlarged measuring 5.4 x 6.5 x 5.7 cm and
contains a complex cystic lesion measuring 6.0 x 4.5 x 5.0 cm.
This cystic lesion appears unilocular and contains an irregular
soft tissue component in the inferior portion of the cyst.  This
contains no internal flow with color Doppler evaluation making it
most likely to represent a focal clot.  In correlation with the
prior CT, the finding on this CT appeared to be located laterally
in the right adnexa suggesting that it was in the right ovary at
the time and may have resolved with this finding in the left ovary
being new since that time.  Short-term reassessment would be
recommended with rescanning in 6 weeks.  We would expect to see
some interval evolution of this process if this indeed represents a
hemorrhagic cyst with focal clot.

No pelvic fluid or separate adnexal masses are noted.
IMPRESSION: Complex left ovarian cyst which has an appearance most suggestive
of a hemorrhagic cyst with organizing clot.  Repeat evaluation in 6
weeks would be recommended for short-term reassessment.  Given the
side of this finding, it is unclear whether this is the same cyst
seen on CT in September 2007.

## 2011-08-01 ENCOUNTER — Encounter (HOSPITAL_COMMUNITY): Payer: Self-pay | Admitting: Emergency Medicine

## 2011-08-01 ENCOUNTER — Emergency Department (HOSPITAL_COMMUNITY)
Admission: EM | Admit: 2011-08-01 | Discharge: 2011-08-02 | Disposition: A | Discharge status: Home / Self Care | Payer: No Typology Code available for payment source | Attending: Emergency Medicine | Admitting: Emergency Medicine

## 2011-08-01 ENCOUNTER — Emergency Department (HOSPITAL_COMMUNITY): Payer: No Typology Code available for payment source

## 2011-08-01 DIAGNOSIS — S93609A Unspecified sprain of unspecified foot, initial encounter: Secondary | ICD-10-CM | POA: Insufficient documentation

## 2011-08-01 DIAGNOSIS — S93602A Unspecified sprain of left foot, initial encounter: Secondary | ICD-10-CM

## 2011-08-01 DIAGNOSIS — Y9289 Other specified places as the place of occurrence of the external cause: Secondary | ICD-10-CM | POA: Insufficient documentation

## 2011-08-01 DIAGNOSIS — I10 Essential (primary) hypertension: Secondary | ICD-10-CM | POA: Insufficient documentation

## 2011-08-01 DIAGNOSIS — Z79899 Other long term (current) drug therapy: Secondary | ICD-10-CM | POA: Insufficient documentation

## 2011-08-01 DIAGNOSIS — S93409A Sprain of unspecified ligament of unspecified ankle, initial encounter: Secondary | ICD-10-CM | POA: Insufficient documentation

## 2011-08-01 DIAGNOSIS — Y9301 Activity, walking, marching and hiking: Secondary | ICD-10-CM | POA: Insufficient documentation

## 2011-08-01 DIAGNOSIS — X500XXA Overexertion from strenuous movement or load, initial encounter: Secondary | ICD-10-CM | POA: Insufficient documentation

## 2011-08-01 HISTORY — DX: Essential (primary) hypertension: I10

## 2011-08-01 NOTE — ED Notes (Signed)
Pt alert, nad, c/o left ankle pain, onset this evening s/p slip fall injury, resp even unlabored, skin pwd

## 2011-08-02 ENCOUNTER — Emergency Department (HOSPITAL_COMMUNITY): Payer: No Typology Code available for payment source

## 2011-08-02 MED ORDER — HYDROCHLOROTHIAZIDE 25 MG PO TABS: 25.0000 mg | ORAL_TABLET | Freq: Every day | ORAL | Status: DC

## 2011-08-02 MED ORDER — HYDROCODONE-ACETAMINOPHEN 5-500 MG PO TABS: 1.0000 | ORAL_TABLET | Freq: Four times a day (QID) | ORAL | Status: AC | PRN

## 2011-08-02 MED ORDER — OXYCODONE-ACETAMINOPHEN 5-325 MG PO TABS
1.0000 | ORAL_TABLET | Freq: Once | ORAL | Status: AC
  Administered 2011-08-02: 1 via ORAL
  Filled 2011-08-02: qty 1

## 2011-08-02 MED ORDER — HYDROCHLOROTHIAZIDE 25 MG PO TABS
25.0000 mg | ORAL_TABLET | Freq: Every day | ORAL | Status: DC
  Administered 2011-08-02: 25 mg via ORAL
  Filled 2011-08-02 (×2): qty 1

## 2011-08-02 NOTE — ED Provider Notes (Signed)
History     CSN: [id number]  Arrival date & time 08/01/11  2125   First MD Initiated Contact with Patient 08/02/11 0027      Chief Complaint  Patient presents with  . Ankle Pain    (Consider location/radiation/quality/duration/timing/severity/associated sxs/prior treatment) Patient is a 41 y.o. female presenting with ankle pain. The history is provided by the patient.  Ankle Pain  The incident occurred 6 to 12 hours ago. The injury mechanism was torsion and a fall. The pain is present in the left foot and left ankle. The quality of the pain is described as aching. The pain is at a severity of 8/10. The pain is moderate. The pain has been constant since onset. Associated symptoms include inability to bear weight. Pertinent negatives include no numbness, no loss of motion, no muscle weakness, no loss of sensation and no tingling.  Pt states she was walking from church and stepped on uneven surface twisting her ankle. Reports pain and swelling in left ankle and foot. Denies any other injuries during her fall. States since then, swelling and pain to left ankle and foot. Pain worsened with palpation, movement, walking. No other complaints.   Past Medical History  Diagnosis Date  . Hypertension     Past Surgical History  Procedure Date  . Abdominal hysterectomy     No family history on file.  History  Substance Use Topics  . Smoking status: Current Everyday Smoker -- 1.0 packs/day  . Smokeless tobacco: Not on file  . Alcohol Use: No    OB History    Grav Para Term Preterm Abortions TAB SAB Ect Mult Living                  Review of Systems  Constitutional: Negative for fever and chills.  Eyes: Negative for visual disturbance.  Respiratory: Negative.   Cardiovascular: Negative.   Musculoskeletal: Positive for joint swelling and arthralgias.  Skin: Negative.   Neurological: Negative for tingling, numbness and headaches.    Allergies  Ace inhibitors; Duloxetine;  Metoprolol succinate; and Metronidazole  Home Medications   Current Outpatient Rx  Name Route Sig Dispense Refill  . HYDROCHLOROTHIAZIDE 25 MG PO TABS Oral Take 25 mg by mouth daily.      BP 214/121  Pulse 80  Temp 98.7 F (37.1 C) (Oral)  Resp 20  Ht 5\' 3"  (1.6 m)  Wt 220 lb (99.791 kg)  BMI 38.97 kg/m2  SpO2 100%  Physical Exam  Nursing note and vitals reviewed. Constitutional: She appears well-developed and well-nourished. No distress.  HENT:  Head: Normocephalic.  Eyes: Conjunctivae are normal.  Cardiovascular: Normal rate, regular rhythm and normal heart sounds.   Pulmonary/Chest: Effort normal and breath sounds normal. No respiratory distress. She has no wheezes. She has no rales.  Musculoskeletal:       Left ankle and foot swelling. Tender to palpation over medial and lateral malleoli of the ankle. Tender to palpation over dorsal foot. Pain with ROM of the ankle and pain with ROM of all toes. Dorsal pedal pulse normal.   Skin: Skin is warm and dry.  Psychiatric: She has a normal mood and affect.    ED Course  Procedures (including critical care time)  Labs Reviewed - No data to display Dg Ankle Complete Left  08/01/2011  *RADIOLOGY REPORT*  Clinical Data: Status post fall; generalized left ankle pain.  LEFT ANKLE COMPLETE - 3+ VIEW  Comparison: None.  Findings: There is no evidence of fracture or  dislocation.  The ankle mortise is intact; the interosseous space is within normal limits.  No talar tilt or subluxation is seen.  The joint spaces are preserved.  Mild diffuse soft tissue swelling is noted about the ankle.  IMPRESSION: No evidence of fracture or dislocation.  Original Report Authenticated By: Tonia Ghent, M.D.   BP elevated. Pt used to take HCTZ, has not had in several months because has not followed up with PCP. Will give a dose here. Will recheck after pain management. She has no CP, headache, SOB, swelling.   Negative x-ray of foot and ankle. ASO  applied, crutches given. BP rechecked 188/100. Pt instructed to take BP medications and follow up to have her BP rechecked in 2 wks. Pt voiced understanding. She is asymptomatic for her HTN, and suspect may be elevated in part due to pain.   1. Ankle sprain   2. Sprain of foot, left   3. Hypertension       MDM          Lottie Mussel, PA 08/02/11 [phone]

## 2011-08-02 NOTE — Discharge Instructions (Signed)
You x-rays did not show a fracture. Keep your ankle elevated, ice several times a day. vicodin for pain, do not drive if taking. Use crutches for 1-2 days as needed. Follow up with your doctor next week for recheck. Take hydrodiuril for your blood pressure. Make sure to follow up to have it rechecked.   Ankle Sprain An ankle sprain is an injury to the strong, fibrous tissues (ligaments) that hold the bones of your ankle joint together.  CAUSES Ankle sprain usually is caused by a fall or by twisting your ankle. People who participate in sports are more prone to these types of injuries.  SYMPTOMS  Symptoms of ankle sprain include:  Pain in your ankle. The pain may be present at rest or only when you are trying to stand or walk.   Swelling.   Bruising. Bruising may develop immediately or within 1 to 2 days after your injury.   Difficulty standing or walking.  DIAGNOSIS  Your caregiver will ask you details about your injury and perform a physical exam of your ankle to determine if you have an ankle sprain. During the physical exam, your caregiver will press and squeeze specific areas of your foot and ankle. Your caregiver will try to move your ankle in certain ways. An X-ray exam may be done to be sure a bone was not broken or a ligament did not separate from one of the bones in your ankle (avulsion).  TREATMENT  Certain types of braces can help stabilize your ankle. Your caregiver can make a recommendation for this. Your caregiver may recommend the use of medication for pain. If your sprain is severe, your caregiver may refer you to a surgeon who helps to restore function to parts of your skeletal system (orthopedist) or a physical therapist. HOME CARE INSTRUCTIONS  Apply ice to your injury for 1 to 2 days or as directed by your caregiver. Applying ice helps to reduce inflammation and pain.  Put ice in a plastic bag.   Place a towel between your skin and the bag.   Leave the ice on for 15 to  20 minutes at a time, every 2 hours while you are awake.   Take over-the-counter or prescription medicines for pain, discomfort, or fever only as directed by your caregiver.   Keep your injured leg elevated, when possible, to lessen swelling.   If your caregiver recommends crutches, use them as instructed. Gradually, put weight on the affected ankle. Continue to use crutches or a cane until you can walk without feeling pain in your ankle.   If you have a plaster splint, wear the splint as directed by your caregiver. Do not rest it on anything harder than a pillow the first 24 hours. Do not put weight on it. Do not get it wet. You may take it off to take a shower or bath.   You may have been given an elastic bandage to wear around your ankle to provide support. If the elastic bandage is too tight (you have numbness or tingling in your foot or your foot becomes cold and blue), adjust the bandage to make it comfortable.   If you have an air splint, you may blow more air into it or let air out to make it more comfortable. You may take your splint off at night and before taking a shower or bath.   Wiggle your toes in the splint several times per day if you are able.  SEEK MEDICAL CARE IF:  You have an increase in bruising, swelling, or pain.   Your toes feel cold.   Pain relief is not achieved with medication.  SEEK IMMEDIATE MEDICAL CARE IF: Your toes are numb or blue or you have severe pain. MAKE SURE YOU:   Understand these instructions.   Will watch your condition.   Will get help right away if you are not doing well or get worse.  Document Released: 02/02/2005 Document Revised: 01/22/2011 Document Reviewed: 09/07/2007 [facility] Patient Information 2012 [place], [place].

## 2011-08-02 NOTE — ED Provider Notes (Signed)
Medical screening examination/treatment/procedure(s) were performed by non-physician practitioner and as supervising physician I was immediately available for consultation/collaboration.  Moyses Pavey, MD 08/02/11 0613 

## 2011-08-02 NOTE — ED Notes (Signed)
Pharmacy called and spoke with Alicia Mosley,  Med not received to due start time,  Alicia Mosley to send now

## 2012-07-30 ENCOUNTER — Encounter (HOSPITAL_COMMUNITY): Payer: Self-pay | Admitting: *Deleted

## 2012-07-30 ENCOUNTER — Emergency Department (HOSPITAL_COMMUNITY)
Admission: EM | Admit: 2012-07-30 | Discharge: 2012-07-30 | Disposition: A | Discharge status: Home / Self Care | Payer: Self-pay | Attending: Emergency Medicine | Admitting: Emergency Medicine

## 2012-07-30 DIAGNOSIS — Z8669 Personal history of other diseases of the nervous system and sense organs: Secondary | ICD-10-CM | POA: Insufficient documentation

## 2012-07-30 DIAGNOSIS — Z8739 Personal history of other diseases of the musculoskeletal system and connective tissue: Secondary | ICD-10-CM | POA: Insufficient documentation

## 2012-07-30 DIAGNOSIS — K089 Disorder of teeth and supporting structures, unspecified: Secondary | ICD-10-CM | POA: Insufficient documentation

## 2012-07-30 DIAGNOSIS — K0889 Other specified disorders of teeth and supporting structures: Secondary | ICD-10-CM

## 2012-07-30 DIAGNOSIS — I10 Essential (primary) hypertension: Secondary | ICD-10-CM | POA: Insufficient documentation

## 2012-07-30 DIAGNOSIS — F17200 Nicotine dependence, unspecified, uncomplicated: Secondary | ICD-10-CM | POA: Insufficient documentation

## 2012-07-30 DIAGNOSIS — Z8679 Personal history of other diseases of the circulatory system: Secondary | ICD-10-CM | POA: Insufficient documentation

## 2012-07-30 HISTORY — DX: Sleep apnea, unspecified: G47.30

## 2012-07-30 HISTORY — DX: Migraine, unspecified, not intractable, without status migrainosus: G43.909

## 2012-07-30 MED ORDER — HYDROCODONE-ACETAMINOPHEN 5-325 MG PO TABS: ORAL_TABLET | ORAL | Status: DC

## 2012-07-30 MED ORDER — HYDROCODONE-ACETAMINOPHEN 5-325 MG PO TABS
1.0000 | ORAL_TABLET | Freq: Once | ORAL | Status: AC
  Administered 2012-07-30: 1 via ORAL
  Filled 2012-07-30: qty 1

## 2012-07-30 MED ORDER — IBUPROFEN 600 MG PO TABS: 600.0000 mg | ORAL_TABLET | Freq: Four times a day (QID) | ORAL | Status: DC | PRN

## 2012-07-30 MED ORDER — PENICILLIN V POTASSIUM 500 MG PO TABS: 500.0000 mg | ORAL_TABLET | Freq: Three times a day (TID) | ORAL | Status: DC

## 2012-07-30 NOTE — ED Notes (Signed)
Pt states has had dental pain/issues for a while, has no insurance, states past 3 days pain has increased, having dental pain R side top and bottom then L bottom side. Pt also states she has not been taking blood pressure medication x 2 years d/t no insurance.

## 2012-07-30 NOTE — ED Provider Notes (Signed)
History    This chart was scribed for Renne Crigler, a non-physician practitioner working with Derwood Kaplan, MD by Frederik Pear, ED Scribe. This patient was seen in room WTR6/WTR6 and the patient's care was started at 2039.   CSN: [id number]  Arrival date & time 07/30/12  2019   First MD Initiated Contact with Patient 07/30/12 2039      Chief Complaint  Patient presents with  . Dental Pain    (Consider location/radiation/quality/duration/timing/severity/associated sxs/prior treatment) The history is provided by the patient and medical records. No language interpreter was used.   HPI Comments: Alicia Mosley is a 41 y.o. female who presents to the Emergency Department complaining of intermittent ongoing dental pain that has worsened over the last 3-4 days. On the right side, she complains of upper and lower dental pain that shoot up the right side of her face to her ear and is aggravated by eating and alleviated by nothing. She also reports left upper dental pain that began within the last 1-2 days. She denies fever. She states that she has treated the pain with ibuprofen and Tylenol at home with no relief. She is allergic to metoprolol succinate, duloxetine, metronidazole, and ace inhibitors. She also reports a h/o of hypertension for which she used to manage with hydrochlorothiazide, but has not taken the medication in the past to years due to not having insurance.   Past Medical History  Diagnosis Date  . Hypertension   . Sleep apnea   . Arthritis   . Migraine     Past Surgical History  Procedure Laterality Date  . Abdominal hysterectomy      No family history on file.  History  Substance Use Topics  . Smoking status: Current Every Day Smoker -- 1.00 packs/day  . Smokeless tobacco: Never Used  . Alcohol Use: Yes    OB History   Grav Para Term Preterm Abortions TAB SAB Ect Mult Living                  Review of Systems  Constitutional: Negative for fever,  diaphoresis, appetite change, fatigue and unexpected weight change.  HENT: Positive for dental problem. Negative for mouth sores and neck stiffness.   Eyes: Negative for visual disturbance.  Respiratory: Negative for cough, chest tightness, shortness of breath and wheezing.   Cardiovascular: Negative for chest pain.  Gastrointestinal: Negative for nausea, vomiting, abdominal pain, diarrhea and constipation.  Endocrine: Negative for polydipsia, polyphagia and polyuria.  Genitourinary: Negative for dysuria, urgency, frequency and hematuria.  Musculoskeletal: Negative for back pain.  Skin: Negative for rash.  Allergic/Immunologic: Negative for immunocompromised state.  Neurological: Negative for syncope, light-headedness and headaches.  Hematological: Does not bruise/bleed easily.  Psychiatric/Behavioral: Negative for sleep disturbance. The patient is not nervous/anxious.     Allergies  Ace inhibitors; Metronidazole; Duloxetine; and Metoprolol succinate  Home Medications   Current Outpatient Rx  Name  Route  Sig  Dispense  Refill  . HYDROcodone-acetaminophen (NORCO/VICODIN) 5-325 MG per tablet      Take 1-2 tablets every 6 hours as needed for severe pain   10 tablet   0   . ibuprofen (ADVIL,MOTRIN) 600 MG tablet   Oral   Take 1 tablet (600 mg total) by mouth every 6 (six) hours as needed for pain.   20 tablet   0   . penicillin v potassium (VEETID) 500 MG tablet   Oral   Take 1 tablet (500 mg total) by mouth 3 (three) times  daily.   21 tablet   0     BP 172/112  Pulse 96  Temp(Src) 98.2 F (36.8 C) (Oral)  Resp 18  SpO2 99%  Physical Exam  Nursing note and vitals reviewed. Constitutional: She appears well-developed and well-nourished. No distress.  HENT:  Head: Normocephalic and atraumatic.  Mouth/Throat: Abnormal dentition. No dental abscesses.  On the right side, both the top and bottom 3rd molars are broken. On the left side, the top 1st molar is tender at the  base. No abscess, but erythema of the gumline is present.   Eyes: EOM are normal. Pupils are equal, round, and reactive to light.  Neck: Normal range of motion. Neck supple. No tracheal deviation present.  Cardiovascular: Normal rate.   Pulmonary/Chest: Effort normal. No respiratory distress.  Abdominal: Soft. She exhibits no distension.  Musculoskeletal: Normal range of motion. She exhibits no edema.  Neurological: She is alert.  Skin: Skin is warm and dry.  Psychiatric: She has a normal mood and affect. Her behavior is normal.    ED Course  Procedures (including critical care time)  DIAGNOSTIC STUDIES: Oxygen Saturation is 99% on room air, normal by my interpretation.    COORDINATION OF CARE:  21:25- Discussed planned course of treatment with the patient, including Vicodin, penicillin, and ibuprofen, who is agreeable at this time.  Labs Reviewed - No data to display No results found.   1. Pain, dental   2. Hypertension    Patient seen and examined. Medications ordered.   Vital signs reviewed and are as follows: Filed Vitals:   07/30/12 2058  BP: 172/112  Pulse: 96  Temp: 98.2 F (36.8 C)  Resp: 18    Patient counseled to take prescribed medications as directed, return with worsening facial or neck swelling, and to follow-up with their dentist as soon as possible.   Patient was informed of high blood pressure reading today.  Patient was counseled about long-term health problems hypertension can cause and was urged to follow-up with a primary care doctor in the next week for a blood pressure recheck.  Patient verbalized understanding.    MDM  Patient with toothache.  No gross abscess but erythema of gumline.  Exam unconcerning for Ludwig's angina or other deep tissue infection in neck.  Will treat with penicillin and pain medicine.  Urged patient to follow-up with dentist.     I personally performed the services described in this documentation, which was scribed in  my presence. The recorded information has been reviewed and is accurate.        Renne Crigler, PA-C 07/31/12 0127

## 2012-07-31 NOTE — ED Provider Notes (Signed)
Medical screening examination/treatment/procedure(s) were performed by non-physician practitioner and as supervising physician I was immediately available for consultation/collaboration.  Brentt Fread, MD 07/31/12 1527 

## 2012-11-03 ENCOUNTER — Encounter (HOSPITAL_COMMUNITY): Payer: Self-pay | Admitting: Emergency Medicine

## 2012-11-03 ENCOUNTER — Emergency Department (INDEPENDENT_AMBULATORY_CARE_PROVIDER_SITE_OTHER)
Admission: EM | Admit: 2012-11-03 | Discharge: 2012-11-03 | Disposition: A | Discharge status: Home / Self Care | Payer: BC Managed Care – PPO | Source: Home / Self Care

## 2012-11-03 DIAGNOSIS — I10 Essential (primary) hypertension: Secondary | ICD-10-CM

## 2012-11-03 LAB — POCT I-STAT, CHEM 8
Calcium, Ion: 1.17 mmol/L (ref 1.12–1.23)
Glucose, Bld: 87 mg/dL (ref 70–99)
HCT: 52 % — ABNORMAL HIGH (ref 36.0–46.0)
Hemoglobin: 17.7 g/dL — ABNORMAL HIGH (ref 12.0–15.0)
TCO2: 24 mmol/L (ref 0–100)

## 2012-11-03 MED ORDER — AMLODIPINE BESYLATE 5 MG PO TABS: 5.0000 mg | ORAL_TABLET | Freq: Every day | ORAL | Status: DC

## 2012-11-03 NOTE — ED Notes (Signed)
C/o hypertension .

## 2012-11-03 NOTE — ED Provider Notes (Signed)
Medical screening examination/treatment/procedure(s) were performed by non-physician practitioner and as supervising physician I was immediately available for consultation/collaboration.  Leslee Home, M.D.  Reuben Likes, MD 11/03/12 1754

## 2012-11-03 NOTE — ED Provider Notes (Signed)
CSN: [id number]     Arrival date & time 11/03/12  1522 History   None    Chief Complaint  Patient presents with  . Hypertension   (Consider location/radiation/quality/duration/timing/severity/associated sxs/prior Treatment) HPI Comments: 42 year old female presents for evaluation of hypertension. She was seen for this earlier today and was supposed to have an oral surgery performed but they would not do this because her blood pressure is high. She was on antihypertensives approximately 2 years ago but had to stop this because she lost her insurance. She got back on her insurance plan with her new job a month ago and she can now afford to treat her hypertension. She feels completely normal today as in no distress. She denies any blurry vision, headache, NVD, chest pain, dizziness, loss of consciousness. She has an appointment scheduled with a primary care physician scheduled for October 8.  Patient is a 42 y.o. female presenting with hypertension.  Hypertension Associated symptoms include headaches (Chronic, but no headache at this time). Pertinent negatives include no chest pain, no abdominal pain and no shortness of breath.    Past Medical History  Diagnosis Date  . Hypertension   . Sleep apnea   . Arthritis   . Migraine    Past Surgical History  Procedure Laterality Date  . Abdominal hysterectomy     History reviewed. No pertinent family history. History  Substance Use Topics  . Smoking status: Current Every Day Smoker -- 1.00 packs/day  . Smokeless tobacco: Never Used  . Alcohol Use: Yes   OB History   Grav Para Term Preterm Abortions TAB SAB Ect Mult Living                 Review of Systems  Constitutional: Negative for fever and chills.  HENT: Positive for dental problem.   Eyes: Negative for visual disturbance.  Respiratory: Negative for cough and shortness of breath.   Cardiovascular: Negative for chest pain, palpitations and leg swelling.  Gastrointestinal:  Negative for nausea, vomiting and abdominal pain.  Endocrine: Negative for polydipsia and polyuria.  Genitourinary: Negative for dysuria, urgency and frequency.  Musculoskeletal: Negative for myalgias and arthralgias.  Skin: Negative for rash.  Neurological: Positive for headaches (Chronic, but no headache at this time). Negative for dizziness, weakness and light-headedness.    Allergies  Ace inhibitors; Metronidazole; Duloxetine; and Metoprolol succinate  Home Medications   Current Outpatient Rx  Name  Route  Sig  Dispense  Refill  . amLODipine (NORVASC) 5 MG tablet   Oral   Take 1 tablet (5 mg total) by mouth daily.   30 tablet   0   . HYDROcodone-acetaminophen (NORCO/VICODIN) 5-325 MG per tablet      Take 1-2 tablets every 6 hours as needed for severe pain   10 tablet   0   . ibuprofen (ADVIL,MOTRIN) 600 MG tablet   Oral   Take 1 tablet (600 mg total) by mouth every 6 (six) hours as needed for pain.   20 tablet   0   . penicillin v potassium (VEETID) 500 MG tablet   Oral   Take 1 tablet (500 mg total) by mouth 3 (three) times daily.   21 tablet   0    BP 178/119  Pulse 82  Temp(Src) 98.5 F (36.9 C) (Oral)  Resp 16  SpO2 94% Physical Exam  Nursing note and vitals reviewed. Constitutional: She is oriented to person, place, and time. Vital signs are normal. She appears well-developed and well-nourished. No  distress.  HENT:  Head: Normocephalic and atraumatic.  Eyes: Conjunctivae are normal. Pupils are equal, round, and reactive to light.  Neck: Normal range of motion. Neck supple.  Cardiovascular: Normal rate, regular rhythm and normal heart sounds.  Exam reveals no gallop and no friction rub.   No murmur heard. Pulmonary/Chest: Effort normal and breath sounds normal. No respiratory distress. She has no wheezes. She has no rales.  Neurological: She is alert and oriented to person, place, and time. She has normal strength. Coordination normal.  Skin: Skin is  warm and dry. No rash noted. She is not diaphoretic.  Psychiatric: She has a normal mood and affect. Judgment normal.    ED Course  Procedures (including critical care time) Labs Review Labs Reviewed  POCT I-STAT, CHEM 8 - Abnormal; Notable for the following:    Hemoglobin 17.7 (*)    HCT 52.0 (*)    All other components within normal limits   Imaging Review No results found.  MDM   1. Hypertension    Exam normal i-STAT normal. I will start her on once daily amlodipine which may also help for migraine prophylaxis. She'll followup with her primary care physician as scheduled   Meds ordered this encounter  Medications  . amLODipine (NORVASC) 5 MG tablet    Sig: Take 1 tablet (5 mg total) by mouth daily.    Dispense:  30 tablet    Refill:  0       Graylon Good, PA-C 11/03/12 1721

## 2012-11-23 ENCOUNTER — Ambulatory Visit: Payer: Self-pay

## 2012-12-18 ENCOUNTER — Encounter (HOSPITAL_COMMUNITY): Payer: Self-pay | Admitting: Emergency Medicine

## 2012-12-18 ENCOUNTER — Emergency Department (HOSPITAL_COMMUNITY)
Admission: EM | Admit: 2012-12-18 | Discharge: 2012-12-18 | Disposition: A | Discharge status: Home / Self Care | Payer: BC Managed Care – PPO | Attending: Emergency Medicine | Admitting: Emergency Medicine

## 2012-12-18 ENCOUNTER — Emergency Department (HOSPITAL_COMMUNITY): Payer: BC Managed Care – PPO

## 2012-12-18 DIAGNOSIS — M129 Arthropathy, unspecified: Secondary | ICD-10-CM | POA: Insufficient documentation

## 2012-12-18 DIAGNOSIS — Y9389 Activity, other specified: Secondary | ICD-10-CM | POA: Insufficient documentation

## 2012-12-18 DIAGNOSIS — IMO0002 Reserved for concepts with insufficient information to code with codable children: Secondary | ICD-10-CM | POA: Insufficient documentation

## 2012-12-18 DIAGNOSIS — F17200 Nicotine dependence, unspecified, uncomplicated: Secondary | ICD-10-CM | POA: Insufficient documentation

## 2012-12-18 DIAGNOSIS — Z8669 Personal history of other diseases of the nervous system and sense organs: Secondary | ICD-10-CM | POA: Insufficient documentation

## 2012-12-18 DIAGNOSIS — K5900 Constipation, unspecified: Secondary | ICD-10-CM | POA: Insufficient documentation

## 2012-12-18 DIAGNOSIS — Z79899 Other long term (current) drug therapy: Secondary | ICD-10-CM | POA: Insufficient documentation

## 2012-12-18 DIAGNOSIS — S6391XA Sprain of unspecified part of right wrist and hand, initial encounter: Secondary | ICD-10-CM

## 2012-12-18 DIAGNOSIS — S6390XA Sprain of unspecified part of unspecified wrist and hand, initial encounter: Secondary | ICD-10-CM | POA: Insufficient documentation

## 2012-12-18 DIAGNOSIS — S63501A Unspecified sprain of right wrist, initial encounter: Secondary | ICD-10-CM

## 2012-12-18 DIAGNOSIS — Y929 Unspecified place or not applicable: Secondary | ICD-10-CM | POA: Insufficient documentation

## 2012-12-18 DIAGNOSIS — S63509A Unspecified sprain of unspecified wrist, initial encounter: Secondary | ICD-10-CM | POA: Insufficient documentation

## 2012-12-18 DIAGNOSIS — I10 Essential (primary) hypertension: Secondary | ICD-10-CM

## 2012-12-18 MED ORDER — HYDROCHLOROTHIAZIDE 25 MG PO TABS: 25.0000 mg | ORAL_TABLET | Freq: Every day | ORAL | Status: DC

## 2012-12-18 MED ORDER — HYDROCODONE-ACETAMINOPHEN 5-325 MG PO TABS
1.0000 | ORAL_TABLET | Freq: Once | ORAL | Status: AC
  Administered 2012-12-18: 1 via ORAL
  Filled 2012-12-18: qty 1

## 2012-12-18 MED ORDER — HYDROCODONE-ACETAMINOPHEN 5-325 MG PO TABS: 2.0000 | ORAL_TABLET | ORAL | Status: DC | PRN
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
## 2012-12-18 NOTE — ED Notes (Signed)
Patient states blood pressure medication makes her constipated and that is why she doesn't take it every day and wants to talk with someone about changing it.

## 2012-12-19 NOTE — ED Provider Notes (Signed)
Medical screening examination/treatment/procedure(s) were performed by non-physician practitioner and as supervising physician I was immediately available for consultation/collaboration.  EKG Interpretation   None         Junius Argyle, MD 12/19/12 1623

## 2013-04-07 ENCOUNTER — Emergency Department (HOSPITAL_COMMUNITY): Payer: BC Managed Care – PPO

## 2013-04-07 ENCOUNTER — Emergency Department (HOSPITAL_COMMUNITY)
Admission: EM | Admit: 2013-04-07 | Discharge: 2013-04-07 | Disposition: A | Discharge status: Home / Self Care | Payer: BC Managed Care – PPO | Attending: Emergency Medicine | Admitting: Emergency Medicine

## 2013-04-07 ENCOUNTER — Encounter (HOSPITAL_COMMUNITY): Payer: Self-pay | Admitting: Emergency Medicine

## 2013-04-07 DIAGNOSIS — Y929 Unspecified place or not applicable: Secondary | ICD-10-CM | POA: Insufficient documentation

## 2013-04-07 DIAGNOSIS — Y9389 Activity, other specified: Secondary | ICD-10-CM | POA: Insufficient documentation

## 2013-04-07 DIAGNOSIS — Z79899 Other long term (current) drug therapy: Secondary | ICD-10-CM | POA: Insufficient documentation

## 2013-04-07 DIAGNOSIS — F17200 Nicotine dependence, unspecified, uncomplicated: Secondary | ICD-10-CM | POA: Insufficient documentation

## 2013-04-07 DIAGNOSIS — S322XXA Fracture of coccyx, initial encounter for closed fracture: Principal | ICD-10-CM

## 2013-04-07 DIAGNOSIS — M129 Arthropathy, unspecified: Secondary | ICD-10-CM | POA: Insufficient documentation

## 2013-04-07 DIAGNOSIS — W108XXA Fall (on) (from) other stairs and steps, initial encounter: Secondary | ICD-10-CM | POA: Insufficient documentation

## 2013-04-07 DIAGNOSIS — S332XXA Dislocation of sacroiliac and sacrococcygeal joint, initial encounter: Secondary | ICD-10-CM

## 2013-04-07 DIAGNOSIS — S3210XA Unspecified fracture of sacrum, initial encounter for closed fracture: Secondary | ICD-10-CM

## 2013-04-07 DIAGNOSIS — I10 Essential (primary) hypertension: Secondary | ICD-10-CM

## 2013-04-07 MED ORDER — KETOROLAC TROMETHAMINE 60 MG/2ML IM SOLN
60.0000 mg | Freq: Once | INTRAMUSCULAR | Status: AC
  Administered 2013-04-07: 60 mg via INTRAMUSCULAR
  Filled 2013-04-07: qty 2

## 2013-04-07 MED ORDER — OXYCODONE-ACETAMINOPHEN 5-325 MG PO TABS
1.0000 | ORAL_TABLET | Freq: Once | ORAL | Status: AC
  Administered 2013-04-07: 1 via ORAL
  Filled 2013-04-07: qty 1

## 2013-04-07 MED ORDER — IBUPROFEN 600 MG PO TABS: 600.0000 mg | ORAL_TABLET | Freq: Four times a day (QID) | ORAL | Status: DC | PRN

## 2013-04-07 MED ORDER — OXYCODONE-ACETAMINOPHEN 5-325 MG PO TABS: 1.0000 | ORAL_TABLET | ORAL | Status: DC | PRN

## 2013-04-07 NOTE — Discharge Instructions (Signed)
Take pain medications as prescribed as needed. Rest. Get a donut pillow and use when sitting. Follow up with primary care doctor for a recheck and to manage your blood pressure which is elevated in emergency department.   Tailbone Injury The tailbone (coccyx) is the small bone at the lower end of the spine. A tailbone injury may involve stretched ligaments, bruising, or a broken bone (fracture). Women are more vulnerable to this injury due to having a wider pelvis. CAUSES  This type of injury typically occurs from falling and landing on the tailbone. Repeated strain or friction from actions such as rowing and bicycling may also injure the area. The tailbone can be injured during childbirth. Infections or tumors may also press on the tailbone and cause pain. Sometimes, the cause of injury is unknown. SYMPTOMS   Bruising.  Pain when sitting.  Painful bowel movements.  In women, pain during intercourse. DIAGNOSIS  Your caregiver can diagnose a tailbone injury based on your symptoms and a physical exam. X-rays may be taken if a fracture is suspected. Your caregiver may also use an MRI scan imaging test to evaluate your symptoms. TREATMENT  Your caregiver may prescribe medicines to help relieve your pain. Most tailbone injuries heal on their own in 4 to 6 weeks. However, if the injury is caused by an infection or tumor, the recovery period may vary. PREVENTION  Wear appropriate padding and sports gear when bicycling and rowing. This can help prevent an injury from repeated strain or friction. HOME CARE INSTRUCTIONS   Put ice on the injured area.  Put ice in a plastic bag.  Place a towel between your skin and the bag.  Leave the ice on for 15-20 minutes, every hour while awake for the first 1 to 2 days.  Sit on a large, rubber or inflated ring or cushion to ease your pain. Lean forward when sitting to help decrease discomfort.  Avoid sitting for long periods of time.  Increase your  activity as the pain allows.  Only take over-the-counter or prescription medicines for pain, discomfort, or fever as directed by your caregiver.  You may use stool softeners if it is painful to have a bowel movement, or as directed by your caregiver.  Eat a diet with plenty of fiber to help prevent constipation.  Keep all follow-up appointments as directed by your caregiver. SEEK MEDICAL CARE IF:   Your pain becomes worse.  Your bowel movements cause a great deal of discomfort.  You are unable to have a bowel movement.  You have a fever. MAKE SURE YOU:  Understand these instructions.  Will watch your condition.  Will get help right away if you are not doing well or get worse. Document Released: 01/31/2000 Document Revised: 04/27/2011 Document Reviewed: 08/28/2010 [facility] Patient Information 2014 [place], [place].

## 2013-04-07 NOTE — ED Provider Notes (Signed)
CSN: [id number]     Arrival date & time 04/07/13  1449 History   First MD Initiated Contact with Patient 04/07/13 1504     Chief Complaint  Patient presents with  . Fall     (Consider location/radiation/quality/duration/timing/severity/associated sxs/prior Treatment) HPI Alicia Mosley is a 43 y.o. female who presents to emergency department after falling down 3 steps yesterday. Patient states this happened around 10 PM last night. She states that she tried to catch herself and she fell onto the left side. She states she must have hit lower back on a step above. Also reports pain in the left hamstring. She is able to walk and move all her extremities, but states that it is most painful to change position to sit. Patient states that her job sitting down, and she was sent here from her job because she's unable to sit there. She denies any numbness or weakness in extremities. She took Tylenol for her pain which does not help. She denies a loss of bowels or urinary incontinence or retention. She did not hit her head a half loss of consciousness. She denies any other injuries. She states she does have chronic back issues in pain. She currently does not have a primary care Dr.  Past Medical History  Diagnosis Date  . Hypertension   . Sleep apnea   . Arthritis   . Migraine    Past Surgical History  Procedure Laterality Date  . Abdominal hysterectomy     No family history on file. History  Substance Use Topics  . Smoking status: Current Every Day Smoker -- 1.00 packs/day  . Smokeless tobacco: Never Used  . Alcohol Use: Yes   OB History   Grav Para Term Preterm Abortions TAB SAB Ect Mult Living                 Review of Systems  Constitutional: Negative for fever and chills.  Respiratory: Negative for cough, chest tightness and shortness of breath.   Cardiovascular: Negative for chest pain, palpitations and leg swelling.  Genitourinary: Negative for dysuria and flank pain.   Musculoskeletal: Positive for arthralgias and back pain. Negative for joint swelling, myalgias, neck pain and neck stiffness.  Skin: Negative for rash.  Neurological: Negative for dizziness, weakness and headaches.  All other systems reviewed and are negative.      Allergies  Ace inhibitors; Metronidazole; Duloxetine; and Metoprolol succinate  Home Medications   Current Outpatient Rx  Name  Route  Sig  Dispense  Refill  . amLODipine (NORVASC) 5 MG tablet   Oral   Take 1 tablet (5 mg total) by mouth daily.   30 tablet   0   . clindamycin (CLEOCIN) 150 MG capsule   Oral   Take 150 mg by mouth 3 (three) times daily.         . hydrochlorothiazide (HYDRODIURIL) 25 MG tablet   Oral   Take 1 tablet (25 mg total) by mouth daily.   15 tablet   0   . HYDROcodone-acetaminophen (NORCO/VICODIN) 5-325 MG per tablet      Take 1-2 tablets every 6 hours as needed for severe pain   10 tablet   0   . HYDROcodone-acetaminophen (NORCO/VICODIN) 5-325 MG per tablet   Oral   Take 2 tablets by mouth every 4 (four) hours as needed for pain.   10 tablet   0    BP 176/101  Pulse 81  Temp(Src) 97.7 F (36.5 C) (Oral)  Resp  14  Ht 5\' 3"  (1.6 m)  SpO2 95% Physical Exam  Nursing note and vitals reviewed. Constitutional: She is oriented to person, place, and time. She appears well-developed and well-nourished. No distress.  HENT:  Head: Normocephalic.  Eyes: Conjunctivae are normal.  Neck: Neck supple.  Cardiovascular: Normal rate, regular rhythm and normal heart sounds.   Pulmonary/Chest: Effort normal and breath sounds normal. No respiratory distress. She has no wheezes. She has no rales.  Musculoskeletal: She exhibits no edema.  Midline lumbar and sacral tenderness. No bruising, swelling, abrasions noted. Tender in the left SI joint and left buttock. Pain with left straight leg raise passively and actively. There is tenderness over her left hamstring muscle. No bruising or  swelling. Pain with left knee flexion actively, no pain with passive knee flexion. Knee exam is normal. Hip appears normal with no pain with palpation, no pain with internal or external rotation.  Neurological: She is alert and oriented to person, place, and time.  5 out of 5 and equal lower extremity strength bilaterally.  Skin: Skin is warm and dry.  Psychiatric: She has a normal mood and affect. Her behavior is normal.    ED Course  Procedures (including critical care time) Labs Review Labs Reviewed - No data to display Imaging Review Dg Lumbar Spine Complete  04/07/2013   CLINICAL DATA:  Lower back pain after fall.  EXAM: LUMBAR SPINE - COMPLETE 4+ VIEW  COMPARISON:  September 11, 2005.  FINDINGS: There is no evidence of lumbar spine fracture. Alignment is normal. Intervertebral disc spaces are maintained. Posterior facet joints appear normal.  IMPRESSION: Normal lumbar spine.   Electronically Signed   By: Roque Lias M.D.   On: 04/07/2013 16:04   Dg Sacrum/coccyx  04/07/2013   CLINICAL DATA:  Fall, injury, pain  EXAM: SACRUM AND COCCYX - 2+ VIEW  COMPARISON:  10/03/2007 CT sagittal reconstructions  FINDINGS: SI joints are symmetric. Visualized sacral ale appear intact. Bony pelvis intact. No diastases.  On lateral view, there is osseous irregularity and malalignment of the sacral coccygeal junction compatible with a sacral coccygeal fracture/dislocation.  IMPRESSION: Distal sacral coccygeal fracture/dislocation, appreciated on the lateral view.   Electronically Signed   By: Ruel Favors M.D.   On: 04/07/2013 16:09    EKG Interpretation   None       MDM   Final diagnoses:  Dislocation of sacrococcygeal joint  Sacrum and coccyx fracture  Hypertension   Pt with coccygal fracture/dislocation after a mechanical fall. Elevated BP in ED. Pt does not like BP medications she is on, and does not have PCP at present. She is asymptomatic for her elevated BP and has no signs or symptoms of end  organ damage. Will need to take her BP meds or follow up with pcp. Will treat with percocet at home. Recommended donut pillow. Follow up with primary care doctor.   Filed Vitals:   04/07/13 1453  BP: 176/101  Pulse: 81  Temp: 97.7 F (36.5 C)  TempSrc: Oral  Resp: 14  Height: 5\' 3"  (1.6 m)  SpO2: 95%     Lottie Mussel, PA-C 04/08/13 0035

## 2013-04-07 NOTE — ED Notes (Signed)
Pt escorted to discharge window. Pt verbalized understanding discharge instructions. In no acute distress.  

## 2013-04-07 NOTE — ED Notes (Signed)
Pt reports falling last night down a set of steps (three). Pt denies LOC or hitting her head. Pt reports pain to the generalized central/left sided tailbone pain and left leg pain. Pt is hypertensive on arrival. Pt states she has a history of hypertension, however doesn't have a PCP to prescribe medications and is unhappy with her current prescription because it constipates her. Pt is A/O x4, in NAD.

## 2013-04-08 NOTE — ED Provider Notes (Signed)
Medical screening examination/treatment/procedure(s) were performed by non-physician practitioner and as supervising physician I was immediately available for consultation/collaboration.  EKG Interpretation   None        Doug SouSam Rishik Tubby, MD 04/08/13 1436

## 2013-04-14 ENCOUNTER — Encounter (HOSPITAL_COMMUNITY): Payer: Self-pay | Admitting: Emergency Medicine

## 2013-04-14 ENCOUNTER — Emergency Department (HOSPITAL_COMMUNITY)
Admission: EM | Admit: 2013-04-14 | Discharge: 2013-04-14 | Disposition: A | Discharge status: Home / Self Care | Payer: BC Managed Care – PPO | Attending: Emergency Medicine | Admitting: Emergency Medicine

## 2013-04-14 DIAGNOSIS — I10 Essential (primary) hypertension: Secondary | ICD-10-CM | POA: Insufficient documentation

## 2013-04-14 DIAGNOSIS — M129 Arthropathy, unspecified: Secondary | ICD-10-CM | POA: Insufficient documentation

## 2013-04-14 DIAGNOSIS — G8911 Acute pain due to trauma: Secondary | ICD-10-CM | POA: Insufficient documentation

## 2013-04-14 DIAGNOSIS — F17200 Nicotine dependence, unspecified, uncomplicated: Secondary | ICD-10-CM | POA: Insufficient documentation

## 2013-04-14 DIAGNOSIS — Z79899 Other long term (current) drug therapy: Secondary | ICD-10-CM | POA: Insufficient documentation

## 2013-04-14 DIAGNOSIS — S322XXA Fracture of coccyx, initial encounter for closed fracture: Secondary | ICD-10-CM

## 2013-04-14 DIAGNOSIS — E669 Obesity, unspecified: Secondary | ICD-10-CM | POA: Insufficient documentation

## 2013-04-14 DIAGNOSIS — Z8669 Personal history of other diseases of the nervous system and sense organs: Secondary | ICD-10-CM | POA: Insufficient documentation

## 2013-04-14 DIAGNOSIS — M533 Sacrococcygeal disorders, not elsewhere classified: Secondary | ICD-10-CM | POA: Insufficient documentation

## 2013-04-14 MED ORDER — AMLODIPINE BESYLATE 5 MG PO TABS
5.0000 mg | ORAL_TABLET | Freq: Once | ORAL | Status: DC
  Filled 2013-04-14: qty 1

## 2013-04-14 MED ORDER — HYDROCODONE-ACETAMINOPHEN 5-325 MG PO TABS
1.0000 | ORAL_TABLET | Freq: Once | ORAL | Status: AC
  Administered 2013-04-14: 1 via ORAL
  Filled 2013-04-14: qty 1

## 2013-04-14 MED ORDER — OXYCODONE-ACETAMINOPHEN 5-325 MG PO TABS: 1.0000 | ORAL_TABLET | ORAL | Status: DC | PRN

## 2013-04-14 NOTE — ED Notes (Addendum)
Pt reports having a broken tail bone on 04/07/2013, which she was seen here. Pt reports that today she is in need of additional time off of work because it is still painful to sit. BP on arrival is 192/123, pulse of 85. Pt endorses headache and blurred vision, however denies dizziness. Pt reports having blood pressure medication, however she states "I don't take it because it constipates me." Pt presented with same statement last week regarding blood pressure and her respective medication. Pt is A/O x4, there is no pronator drift, pt has equal strength in all extremities and ambulates to triage without complication or difficulty.

## 2013-04-14 NOTE — Discharge Instructions (Signed)
Emergency Department Resource Guide 1) Find a Doctor and Pay Out of Pocket Although you won't have to find out who is covered by your insurance plan, it is a good idea to ask around and get recommendations. You will then need to call the office and see if the doctor you have chosen will accept you as a new patient and what types of options they offer for patients who are self-pay. Some doctors offer discounts or will set up payment plans for their patients who do not have insurance, but you will need to ask so you aren't surprised when you get to your appointment.  2) Contact Your Local Health Department Not all health departments have doctors that can see patients for sick visits, but many do, so it is worth a call to see if yours does. If you don't know where your local health department is, you can check in your phone book. The CDC also has a tool to help you locate your state's health department, and many state websites also have listings of all of their local health departments.  3) Find a [facility] If your illness is not likely to be very severe or complicated, you may want to try a walk in clinic. These are popping up all over the country in pharmacies, drugstores, and shopping centers. They're usually staffed by nurse practitioners or physician assistants that have been trained to treat common illnesses and complaints. They're usually fairly quick and inexpensive. However, if you have serious medical issues or chronic medical problems, these are probably not your best option.  No Primary Care Doctor: - Call Health Connect at  [phone] - they can help you locate a primary care doctor that  accepts your insurance, provides certain services, etc. - Physician Referral Service- [phone]  Chronic Pain Problems: Organization         Address  Phone   Notes  [facility]  [phone] Patients need to be referred by their primary care doctor.   Medication  Assistance: Organization         Address  Phone   Notes  [facility] Medication [facility] [place] [place], [place] [address] [phone] --Must be a resident of [facility] -- Must have NO insurance coverage whatsoever (no Medicaid/ Medicare, etc.) -- The pt. MUST have a primary care doctor that directs their care regularly and follows them in the community   [facility]  [phone]   [employer]  [phone]    Agencies that provide inexpensive medical care: Organization         Address  Phone   Notes  [place]  [phone]   [facility]    [phone]   [facility] [place], [place] [address] [phone]   [place] [address] [place] [phone]   Planned Parenthood    [phone]   [facility]    [phone]   [place] [address], [place] Phone:  [phone], Fax:  [phone] Hours of Operation:  9 am - 6 pm, M-F.  Also accepts Medicaid/Medicare and self-pay.  [facility] for [address], [address], [place] Phone: [phone], Fax: [phone]. Hours of Operation:  8:30 am - 5:30 pm, M-F.  Also accepts Medicaid and self-pay.  [facility] [address], [place] Phone: [phone]   [place], [place], [place] [phone], Ext. [phone] Mondays & Thursdays: 7-9 AM.  First 15 patients are seen on a first come, first serve basis.    [place] Providers:  Organization         Address  Phone   Notes  [facility] [address], [place] [phone] Also accepts self-pay patients.  [facility] [id number] [place], [place]  [phone]   [place], [address], [place]  [phone]   [facility] [address], [place] [phone]   Lucianne Lei [address], [place]   [phone] Only accepts [place] Access [place] patients after they have their name applied to their card.   Self-Pay (no insurance) in [facility]:  Organization         Address  Phone   Notes  Sickle Cell Patients, [facility] [place] [phone]   [facility] [place] [phone]   [facility] [place], [place], [place] [phone]   [facility]/Dr. Osei-Bonsu  [address] [place] or [address], [place] [phone] Phone number for both [place] and [place] locations is the same.  [facility] [address] [place] [phone]   [facility] [address] [place] or [address] [phone]   [facility] [address] [place] [phone], phone; [phone], fax Sees patients 1st and 3rd Saturday of every month.  Must not qualify for public or private insurance (i.e. Medicaid, Medicare, [place] Health Choice, Veterans' Benefits)  Household income should be no more than 200% of the poverty level The clinic cannot treat you if you are pregnant or think you are pregnant  Sexually transmitted diseases are not treated at the clinic.   Dental Care: Organization         Address  Phone  Notes  [facility] Department of [facility] [place] [phone] Accepts children up to age 44 who are enrolled in [place] or [place]; pregnant women with a Medicaid card; and children who have applied for Medicaid or [place] Health Choice, but were declined, whose parents can pay a reduced fee at time of service.  [facility]  Department of [facility]  [address], [place] [phone] Accepts children up to age 25 who are enrolled in [place] or [place]; pregnant women with a Medicaid card; and children who have applied for Medicaid or [place] Health Choice, but were declined, whose parents can pay a reduced fee at time of service.  [place] Adult [facility] Access PROGRAM  [place] [phone] Patients are seen by appointment only. Walk-ins are not accepted. [place] will see patients 32 years of age and older. Monday - Tuesday (8am-5pm) Most Wednesdays (8:30-5pm) $30 per visit, cash only  [facility] Adult Dental Access PROGRAM  [address], [facility] [phone] Patients are seen by appointment only. Walk-ins are not accepted. [place] will see patients 18 years of age and older. One Wednesday  Evening (Monthly: Volunteer Based).  $30 per visit, cash only  [place]  [phone] for adults; Children under age 61, call Graduate Pediatric Dentistry at [phone]. Children aged 59-14, please call [phone] to request a pediatric application.  Dental services are provided in all areas of dental care including fillings, crowns and bridges, complete and partial dentures, implants, gum treatment, root canals, and extractions. Preventive care is also provided. Treatment is provided to both adults and children. Patients are selected via a lottery and there is often a waiting list.   [facility] [address], [place]  [phone] [email or web address]   [facility] [address] [place], [place] [phone], Ext. [phone] Second and Fourth Thursday of each month, opens at 6:30 AM; Clinic ends at 9 AM.  Patients are seen on a first-come first-served basis, and a limited number are seen during each clinic.   [facility]  [address] [place], [place] [phone]    Eligibility Requirements You must have lived in [place], [place], or [place] counties for at least the last three months.   You cannot be eligible for state or federal sponsored [employer], including [employer], [place], or [employer].   You generally cannot be eligible for healthcare insurance through your employer.    How to apply: Eligibility screenings are held every Tuesday and Wednesday afternoon from 1:00 pm until 4:00 pm. You do not need an appointment for the interview!  [facility] [address], [place], [place]   [place]  [place] Department  [place]  [phone]    Behavioral Health Resources in the Community: Intensive Outpatient Programs Organization         Address  Phone  Notes  [place] [facility]. [address], [place], [place] [phone]   [facility] Outpatient [address], [place], [place]   ADS: [facility] [address] [place], [place]   [place] [address]  [place], [place] or [phone]   Substance Abuse Resources Organization         Address  Phone  Notes  [facility]  [phone]   [place]  [phone]   [facility] [place]   Chinita Pester  [phone]   Residential & Outpatient Substance Abuse Program  [phone]   Psychological Services Organization         Address  Phone  Notes  [facility] [place]  [place]  [phone]   [place] [address] [place] or [phone]    Mobile Crisis Teams Organization         Address  Phone  Notes  [facility], Mobile Crisis Care Unit  [phone]   [facility]  [address]  [place], [place]   [place] [address], [place] [phone]    Self-Help/Support Groups Organization         Address  Phone             Notes  [place]. of [place] - variety of support groups  [place] Call for more information  Narcotics Anonymous (NA), [facility] [address], [employer] [place]  2 meetings at this location  Residential Treatment Programs Organization         Address  Phone  Notes  [facility] [address],    [place]  [phone]   [facility]  [address] [place] [address], [place], [place] [phone]   [facility] Treatment Facility [address] [place], [place] [phone] Admissions: 8am-3pm M-F  [facility] [address],    [place], [place] [phone]   [facility] [address] [place], [place], [place] [phone]   [facility] [address],  [place], [place] [phone]   [facility] - Intensive Outpatient [address], [place], [place] [phone]   [facility] ([facility] [address] [place]  [place], [place] [phone] or [phone]   [facility] ([facility]) [address], [place], [place] [phone] Accepts Medicaid  [facility] [place] [address],  [place] [phone] Substance Abuse/Addiction Treatment   [facility] Organization         Address  Phone  Notes  [facility]  [phone]   Angie Fava, PhD [address] [place], [place]   [phone] or [phone]   [facility]   [address] [place], [place] [phone]   [facility] [address], [place], [place] [phone] Insurance/Medicaid/sponsorship through [facility] and Families [address]                                    [place], [place] [phone] Therapy/tele-psych/case    [facility] [address], [place] [phone]    Dr. [place] Mustache  [phone]   [facility] [place]  [employer] [facility] Dept. 1) [address] [place] 2) [address], [place] 3)  [address], [place] [phone]  [phone]   [facility] Child Abuse Hotline [phone] or [phone] (After Hours)        Arterial Hypertension Arterial hypertension (high blood pressure) is a condition of elevated pressure in your blood vessels. Hypertension over a long period of time is a risk factor for strokes, heart attacks, and heart failure. It is also the leading cause of kidney (renal) failure.  CAUSES   In Adults -- Over 90% of all hypertension has no known cause. This is called essential or primary hypertension. In the other 10% of people with hypertension, the increase in blood pressure is caused by another disorder. This is called secondary hypertension. Important causes of secondary hypertension are:  Heavy alcohol use.  Obstructive sleep apnea.  Hyperaldosterosim (Conn's syndrome).  Steroid use.  Chronic kidney failure.  Hyperparathyroidism.  Medications.  Renal artery stenosis.  Pheochromocytoma.  Cushing's disease.  Coarctation of the aorta.  Scleroderma renal crisis.  Licorice (in excessive amounts).  Drugs (cocaine, methamphetamine). Your caregiver can explain any items above that apply to you.  In Children -- Secondary hypertension is more common and should always be considered.  Pregnancy -- Few women of childbearing age have high blood pressure. However, up to 10% of them develop hypertension of pregnancy. Generally, this will not harm the woman. It may be a sign of 3 complications of pregnancy: preeclampsia, HELLP syndrome, and eclampsia. Follow up and control with medication is necessary. SYMPTOMS   This condition normally  does not produce any noticeable symptoms. It  is usually found during a routine exam.  Malignant hypertension is a late problem of high blood pressure. It may have the following symptoms:  Headaches.  Blurred vision.  End-organ damage (this means your kidneys, heart, lungs, and other organs are being damaged).  Stressful situations can increase the blood pressure. If a person with normal blood pressure has their blood pressure go up while being seen by their caregiver, this is often termed "white coat hypertension." Its importance is not known. It may be related with eventually developing hypertension or complications of hypertension.  Hypertension is often confused with mental tension, stress, and anxiety. DIAGNOSIS  The diagnosis is made by 3 separate blood pressure measurements. They are taken at least 1 week apart from each other. If there is organ damage from hypertension, the diagnosis may be made without repeat measurements. Hypertension is usually identified by having blood pressure readings:  Above 140/90 mmHg measured in both arms, at 3 separate times, over a couple weeks.  Over 130/80 mmHg should be considered a risk factor and may require treatment in patients with diabetes. Blood pressure readings over 120/80 mmHg are called "pre-hypertension" even in non-diabetic patients. To get a true blood pressure measurement, use the following guidelines. Be aware of the factors that can alter blood pressure readings.  Take measurements at least 1 hour after caffeine.  Take measurements 30 minutes after smoking and without any stress. This is another reason to quit smoking  it raises your blood pressure.  Use a proper cuff size. Ask your caregiver if you are not sure about your cuff size.  Most home blood pressure cuffs are automatic. They will measure systolic and diastolic pressures. The systolic pressure is the pressure reading at the start of sounds. Diastolic pressure is the pressure at which the sounds disappear. If you are  elderly, measure pressures in multiple postures. Try sitting, lying or standing.  Sit at rest for a minimum of 5 minutes before taking measurements.  You should not be on any medications like decongestants. These are found in many cold medications.  Record your blood pressure readings and review them with your caregiver. If you have hypertension:  Your caregiver may do tests to be sure you do not have secondary hypertension (see "causes" above).  Your caregiver may also look for signs of metabolic syndrome. This is also called Syndrome X or Insulin Resistance Syndrome. You may have this syndrome if you have type 2 diabetes, abdominal obesity, and abnormal blood lipids in addition to hypertension.  Your caregiver will take your medical and family history and perform a physical exam.  Diagnostic tests may include blood tests (for glucose, cholesterol, potassium, and kidney function), a urinalysis, or an EKG. Other tests may also be necessary depending on your condition. PREVENTION  There are important lifestyle issues that you can adopt to reduce your chance of developing hypertension:  Maintain a normal weight.  Limit the amount of salt (sodium) in your diet.  Exercise often.  Limit alcohol intake.  Get enough potassium in your diet. Discuss specific advice with your caregiver.  Follow a DASH diet (dietary approaches to stop hypertension). This diet is rich in fruits, vegetables, and low-fat dairy products, and avoids certain fats. PROGNOSIS  Essential hypertension cannot be cured. Lifestyle changes and medical treatment can lower blood pressure and reduce complications. The prognosis of secondary hypertension depends on the underlying cause. Many people whose hypertension is controlled with medicine or lifestyle  changes can live a normal, healthy life.  RISKS AND COMPLICATIONS  While high blood pressure alone is not an illness, it often requires treatment due to its short- and  long-term effects on many organs. Hypertension increases your risk for:  CVAs or strokes (cerebrovascular accident).  Heart failure due to chronically high blood pressure (hypertensive cardiomyopathy).  Heart attack (myocardial infarction).  Damage to the retina (hypertensive retinopathy).  Kidney failure (hypertensive nephropathy). Your caregiver can explain list items above that apply to you. Treatment of hypertension can significantly reduce the risk of complications. TREATMENT   For overweight patients, weight loss and regular exercise are recommended. Physical fitness lowers blood pressure.  Mild hypertension is usually treated with diet and exercise. A diet rich in fruits and vegetables, fat-free dairy products, and foods low in fat and salt (sodium) can help lower blood pressure. Decreasing salt intake decreases blood pressure in a 1/3 of people.  Stop smoking if you are a smoker. The steps above are highly effective in reducing blood pressure. While these actions are easy to suggest, they are difficult to achieve. Most patients with moderate or severe hypertension end up requiring medications to bring their blood pressure down to a normal level. There are several classes of medications for treatment. Blood pressure pills (antihypertensives) will lower blood pressure by their different actions. Lowering the blood pressure by 10 mmHg may decrease the risk of complications by as much as 25%. The goal of treatment is effective blood pressure control. This will reduce your risk for complications. Your caregiver will help you determine the best treatment for you according to your lifestyle. What is excellent treatment for one person, may not be for you. HOME CARE INSTRUCTIONS   Do not smoke.  Follow the lifestyle changes outlined in the "Prevention" section.  If you are on medications, follow the directions carefully. Blood pressure medications must be taken as prescribed. Skipping doses  reduces their benefit. It also puts you at risk for problems.  Follow up with your caregiver, as directed.  If you are asked to monitor your blood pressure at home, follow the guidelines in the "Diagnosis" section above. SEEK MEDICAL CARE IF:   You think you are having medication side effects.  You have recurrent headaches or lightheadedness.  You have swelling in your ankles.  You have trouble with your vision. SEEK IMMEDIATE MEDICAL CARE IF:   You have sudden onset of chest pain or pressure, difficulty breathing, or other symptoms of a heart attack.  You have a severe headache.  You have symptoms of a stroke (such as sudden weakness, difficulty speaking, difficulty walking). MAKE SURE YOU:   Understand these instructions.  Will watch your condition.  Will get help right away if you are not doing well or get worse. Document Released: 02/02/2005 Document Revised: 04/27/2011 Document Reviewed: 09/02/2006 [facility] Patient Information 2014 [place], [place].

## 2013-04-14 NOTE — ED Provider Notes (Signed)
CSN: [id number]     Arrival date & time 04/14/13  1617 History  First MD Initiated Contact with Patient 04/14/13 2059     Chief Complaint  Patient presents with  . Tailbone Pain   HPI Patient was seen in the emergency department on February 20. Patient had fallen and landed on her coccyx. Patient had x-rays that showed a fracture. The patient states she came back to the emergency room today because she is still having pain and does not feel like she could work. Her work note ran out. Patient denies any other trouble. She denies any chest pain shortness of breath or any other complaints. Patient does have history of hypertension but has not been taking her medications. Past Medical History  Diagnosis Date  . Hypertension   . Sleep apnea   . Arthritis   . Migraine    Past Surgical History  Procedure Laterality Date  . Abdominal hysterectomy     No family history on file. History  Substance Use Topics  . Smoking status: Current Every Day Smoker -- 1.00 packs/day  . Smokeless tobacco: Never Used  . Alcohol Use: Yes   OB History   Grav Para Term Preterm Abortions TAB SAB Ect Mult Living                 Review of Systems  All other systems reviewed and are negative.      Allergies  Ace inhibitors; Metronidazole; Duloxetine; and Metoprolol succinate  Home Medications   Current Outpatient Rx  Name  Route  Sig  Dispense  Refill  . acetaminophen (TYLENOL) 325 MG tablet   Oral   Take 650 mg by mouth every 6 (six) hours as needed (pain).         Marland Kitchen amLODipine (NORVASC) 5 MG tablet   Oral   Take 1 tablet (5 mg total) by mouth daily.   30 tablet   0   . ibuprofen (ADVIL,MOTRIN) 600 MG tablet   Oral   Take 1 tablet (600 mg total) by mouth every 6 (six) hours as needed.   30 tablet   0   . oxyCODONE-acetaminophen (PERCOCET/ROXICET) 5-325 MG per tablet   Oral   Take 1 tablet by mouth every 4 (four) hours as needed for severe pain.   20 tablet   0    BP 192/123   Pulse 85  Temp(Src) 97.9 F (36.6 C) (Oral)  Resp 20  SpO2 100% Physical Exam  Nursing note and vitals reviewed. Constitutional: She appears well-developed and well-nourished. No distress.  Obese  HENT:  Head: Normocephalic and atraumatic.  Right Ear: External ear normal.  Left Ear: External ear normal.  Eyes: Conjunctivae are normal. Right eye exhibits no discharge. Left eye exhibits no discharge. No scleral icterus.  Neck: Neck supple. No tracheal deviation present.  Cardiovascular: Normal rate, regular rhythm and intact distal pulses.   Pulmonary/Chest: Effort normal and breath sounds normal. No stridor. No respiratory distress. She has no wheezes. She has no rales.  Abdominal: Soft. Bowel sounds are normal. She exhibits no distension. There is no tenderness. There is no rebound and no guarding.  Musculoskeletal: She exhibits no edema and no tenderness.  Tenderness to palpation in the coccyx region  Neurological: She is alert. She has normal strength. No cranial nerve deficit (no facial droop, extraocular movements intact, no slurred speech) or sensory deficit. She exhibits normal muscle tone. She displays no seizure activity. Coordination normal.  Skin: Skin is warm and dry.  No rash noted.  Psychiatric: She has a normal mood and affect.    ED Course  Procedures (including critical care time) Labs Review Labs Reviewed - No data to display Imaging Review No results found.   EKG Interpretation None      MDM   Final diagnoses:  Fracture of coccyx  HTN (hypertension)    I stressed the importance of followup with a primary care Dr. to the patient. She is to follow up regarding her blood pressure. She should also followup if she continues to have pain in the tailbone region.    Celene KrasJon R Brittiney Dicostanzo, MD 04/14/13 2121

## 2013-04-21 ENCOUNTER — Emergency Department (HOSPITAL_COMMUNITY)
Admission: EM | Admit: 2013-04-21 | Discharge: 2013-04-21 | Disposition: A | Discharge status: Home / Self Care | Payer: BC Managed Care – PPO | Attending: Emergency Medicine | Admitting: Emergency Medicine

## 2013-04-21 ENCOUNTER — Encounter (HOSPITAL_COMMUNITY): Payer: Self-pay | Admitting: Emergency Medicine

## 2013-04-21 DIAGNOSIS — I10 Essential (primary) hypertension: Secondary | ICD-10-CM | POA: Insufficient documentation

## 2013-04-21 DIAGNOSIS — Z8781 Personal history of (healed) traumatic fracture: Secondary | ICD-10-CM | POA: Insufficient documentation

## 2013-04-21 DIAGNOSIS — M129 Arthropathy, unspecified: Secondary | ICD-10-CM | POA: Insufficient documentation

## 2013-04-21 DIAGNOSIS — G8911 Acute pain due to trauma: Secondary | ICD-10-CM | POA: Insufficient documentation

## 2013-04-21 DIAGNOSIS — M549 Dorsalgia, unspecified: Secondary | ICD-10-CM | POA: Insufficient documentation

## 2013-04-21 DIAGNOSIS — F17200 Nicotine dependence, unspecified, uncomplicated: Secondary | ICD-10-CM | POA: Insufficient documentation

## 2013-04-21 DIAGNOSIS — Z79899 Other long term (current) drug therapy: Secondary | ICD-10-CM | POA: Insufficient documentation

## 2013-04-21 MED ORDER — METHOCARBAMOL 750 MG PO TABS: 750.0000 mg | ORAL_TABLET | Freq: Four times a day (QID) | ORAL | Status: DC

## 2013-04-21 NOTE — Discharge Instructions (Signed)
Tailbone Injury °The tailbone (coccyx) is the small bone at the lower end of the spine. A tailbone injury may involve stretched ligaments, bruising, or a broken bone (fracture). Women are more vulnerable to this injury due to having a wider pelvis. °CAUSES  °This type of injury typically occurs from falling and landing on the tailbone. Repeated strain or friction from actions such as rowing and bicycling may also injure the area. The tailbone can be injured during childbirth. Infections or tumors may also press on the tailbone and cause pain. Sometimes, the cause of injury is unknown. °SYMPTOMS  °· Bruising. °· Pain when sitting. °· Painful bowel movements. °· In women, pain during intercourse. °DIAGNOSIS  °Your caregiver can diagnose a tailbone injury based on your symptoms and a physical exam. X-rays may be taken if a fracture is suspected. Your caregiver may also use an MRI scan imaging test to evaluate your symptoms. °TREATMENT  °Your caregiver may prescribe medicines to help relieve your pain. Most tailbone injuries heal on their own in 4 to 6 weeks. However, if the injury is caused by an infection or tumor, the recovery period may vary. °PREVENTION  °Wear appropriate padding and sports gear when bicycling and rowing. This can help prevent an injury from repeated strain or friction. °HOME CARE INSTRUCTIONS  °· Put ice on the injured area. °· Put ice in a plastic bag. °· Place a towel between your skin and the bag. °· Leave the ice on for 15-20 minutes, every hour while awake for the first 1 to 2 days. °· Sit on a large, rubber or inflated ring or cushion to ease your pain. Lean forward when sitting to help decrease discomfort. °· Avoid sitting for long periods of time. °· Increase your activity as the pain allows. °· Only take over-the-counter or prescription medicines for pain, discomfort, or fever as directed by your caregiver. °· You may use stool softeners if it is painful to have a bowel movement, or as  directed by your caregiver. °· Eat a diet with plenty of fiber to help prevent constipation. °· Keep all follow-up appointments as directed by your caregiver. °SEEK MEDICAL CARE IF:  °· Your pain becomes worse. °· Your bowel movements cause a great deal of discomfort. °· You are unable to have a bowel movement. °· You have a fever. °MAKE SURE YOU: °· Understand these instructions. °· Will watch your condition. °· Will get help right away if you are not doing well or get worse. °Document Released: 01/31/2000 Document Revised: 04/27/2011 Document Reviewed: 08/28/2010 °ExitCare® Patient Information ©2014 [employer]. ° °

## 2013-04-21 NOTE — ED Provider Notes (Signed)
CSN: [id number]     Arrival date & time 04/21/13  1523 History   First MD Initiated Contact with Patient 04/21/13 1714     Chief Complaint  Patient presents with  . Tailbone Pain     (Consider location/radiation/quality/duration/timing/severity/associated sxs/prior Treatment) The history is provided by the patient.   patient here complaining of tailbone pain since being diagnosed with a sacral fracture last month. Denies any radicular symptoms or numbness or weakness. Denies any saddle anesthesias. Pain characterized as sharp and worse with movement. Has used over-the-counter medications without relief. Pain is also worse with sitting. Denies any new injuries at this time.  Past Medical History  Diagnosis Date  . Hypertension   . Sleep apnea   . Arthritis   . Migraine    Past Surgical History  Procedure Laterality Date  . Abdominal hysterectomy     No family history on file. History  Substance Use Topics  . Smoking status: Current Every Day Smoker -- 1.00 packs/day  . Smokeless tobacco: Never Used  . Alcohol Use: Yes   OB History   Grav Para Term Preterm Abortions TAB SAB Ect Mult Living                 Review of Systems  All other systems reviewed and are negative.      Allergies  Ace inhibitors; Metronidazole; Duloxetine; Flexeril; and Metoprolol succinate  Home Medications   Current Outpatient Rx  Name  Route  Sig  Dispense  Refill  . acetaminophen (TYLENOL) 325 MG tablet   Oral   Take 650 mg by mouth every 6 (six) hours as needed (pain).         . hydrochlorothiazide (HYDRODIURIL) 25 MG tablet   Oral   Take 25 mg by mouth daily.         Marland Kitchen. oxyCODONE-acetaminophen (PERCOCET/ROXICET) 5-325 MG per tablet   Oral   Take 1 tablet by mouth every 4 (four) hours as needed for severe pain.   20 tablet   0   . methocarbamol (ROBAXIN-750) 750 MG tablet   Oral   Take 1 tablet (750 mg total) by mouth 4 (four) times daily.   30 tablet   0    BP 211/109   Pulse 79  Temp(Src) 98.3 F (36.8 C) (Oral)  Resp 20  SpO2 98% Physical Exam  Nursing note and vitals reviewed. Constitutional: She is oriented to person, place, and time. She appears well-developed and well-nourished.  Non-toxic appearance. No distress.  HENT:  Head: Normocephalic and atraumatic.  Eyes: Conjunctivae, EOM and lids are normal. Pupils are equal, round, and reactive to light.  Neck: Normal range of motion. Neck supple. No tracheal deviation present. No mass present.  Cardiovascular: Normal rate, regular rhythm and normal heart sounds.  Exam reveals no gallop.   No murmur heard. Pulmonary/Chest: Effort normal and breath sounds normal. No stridor. No respiratory distress. She has no decreased breath sounds. She has no wheezes. She has no rhonchi. She has no rales.  Abdominal: Soft. Normal appearance and bowel sounds are normal. She exhibits no distension. There is no tenderness. There is no rebound and no CVA tenderness.  Musculoskeletal: Normal range of motion. She exhibits no edema and no tenderness.       Back:  Neurological: She is alert and oriented to person, place, and time. She has normal strength. No cranial nerve deficit or sensory deficit. GCS eye subscore is 4. GCS verbal subscore is 5. GCS motor subscore is  6.  Skin: Skin is warm and dry. No abrasion and no rash noted.  Psychiatric: She has a normal mood and affect. Her speech is normal and behavior is normal.    ED Course  Procedures (including critical care time) Labs Review Labs Reviewed - No data to display Imaging Review No results found.   EKG Interpretation None      MDM   Final diagnoses:  Back pain    Patient without focal neurological deficits. Suspect that she does have residual pain. She is compliant with her blood pressure meds and has been asymptomatic. Repeat blood pressure has decreased.  Toy Baker, MD 04/21/13 [id number]
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
## 2013-05-02 ENCOUNTER — Emergency Department (HOSPITAL_COMMUNITY)
Admission: EM | Admit: 2013-05-02 | Discharge: 2013-05-02 | Disposition: A | Discharge status: Home / Self Care | Payer: BC Managed Care – PPO | Attending: Emergency Medicine | Admitting: Emergency Medicine

## 2013-05-02 ENCOUNTER — Encounter (HOSPITAL_COMMUNITY): Payer: Self-pay | Admitting: Emergency Medicine

## 2013-05-02 DIAGNOSIS — F17200 Nicotine dependence, unspecified, uncomplicated: Secondary | ICD-10-CM | POA: Insufficient documentation

## 2013-05-02 DIAGNOSIS — W01119A Fall on same level from slipping, tripping and stumbling with subsequent striking against unspecified sharp object, initial encounter: Secondary | ICD-10-CM | POA: Insufficient documentation

## 2013-05-02 DIAGNOSIS — S81809A Unspecified open wound, unspecified lower leg, initial encounter: Principal | ICD-10-CM

## 2013-05-02 DIAGNOSIS — S81009A Unspecified open wound, unspecified knee, initial encounter: Secondary | ICD-10-CM | POA: Insufficient documentation

## 2013-05-02 DIAGNOSIS — S91009A Unspecified open wound, unspecified ankle, initial encounter: Principal | ICD-10-CM

## 2013-05-02 DIAGNOSIS — I10 Essential (primary) hypertension: Secondary | ICD-10-CM | POA: Insufficient documentation

## 2013-05-02 DIAGNOSIS — R58 Hemorrhage, not elsewhere classified: Secondary | ICD-10-CM | POA: Insufficient documentation

## 2013-05-02 DIAGNOSIS — S81811A Laceration without foreign body, right lower leg, initial encounter: Secondary | ICD-10-CM

## 2013-05-02 DIAGNOSIS — W268XXA Contact with other sharp object(s), not elsewhere classified, initial encounter: Secondary | ICD-10-CM | POA: Insufficient documentation

## 2013-05-02 DIAGNOSIS — Z79899 Other long term (current) drug therapy: Secondary | ICD-10-CM | POA: Insufficient documentation

## 2013-05-02 DIAGNOSIS — Y9389 Activity, other specified: Secondary | ICD-10-CM | POA: Insufficient documentation

## 2013-05-02 DIAGNOSIS — Y929 Unspecified place or not applicable: Secondary | ICD-10-CM | POA: Insufficient documentation

## 2013-05-02 NOTE — Discharge Instructions (Signed)
Keep wound clean. Follow up with your PCP in 10 days for suture removal. Refer to attached documents for more information.
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
## 2013-05-02 NOTE — ED Provider Notes (Signed)
Medical screening examination/treatment/procedure(s) were performed by non-physician practitioner and as supervising physician I was immediately available for consultation/collaboration.  Doshie Maggi T Galileo Colello, MD 05/02/13 1512 

## 2013-05-02 NOTE — ED Provider Notes (Signed)
CSN: [id number]     Arrival date & time 05/02/13  1405 History  This chart was scribed for non-physician practitioner working with Alicia Baker, MD by Ashley Jacobs, ED scribe. This patient was seen in room WTR9/WTR9 and the patient's care was started at 2:45 PM.   First MD Initiated Contact with Patient 05/02/13 1429     Chief Complaint  Patient presents with  . Extremity Laceration     (Consider location/radiation/quality/duration/timing/severity/associated sxs/prior Treatment) Patient is a 43 y.o. female presenting with skin laceration. The history is provided by the patient and medical records. No language interpreter was used.  Laceration Location:  Leg Leg laceration location:  R lower leg Depth:  Cutaneous Time since incident:  1 day Laceration mechanism:  Metal edge Pain details:    Severity:  Mild   Timing:  Constant Foreign body present:  No foreign bodies Relieved by:  Nothing Worsened by:  Nothing tried Ineffective treatments:  None tried Tetanus status:  Up to date  HPI Comments: Alicia Mosley is a 43 y.o. female who presents to the Emergency Department complaining of extremeity laceration to her right medial lower leg that occurred yesterday afternoon when she fell backwards into her leg. The site continues to bleed and she has changed her bandage three times today. She concerned about infection. Pt mentions she feels weak and she felt like she was going to have syncope while at work. Pt denies drinking more water.  She does not take blood thinners but reports being a "free bleeder".  She has tried applying Neosporin to the site. Her tetanus shot is UTD.  Past Medical History  Diagnosis Date  . Hypertension   . Sleep apnea   . Migraine    Past Surgical History  Procedure Laterality Date  . Abdominal hysterectomy     Family History  Problem Relation Age of Onset  . Hypertension Mother   . Diabetes Mother   . Stroke Mother   . Heart failure Mother     History  Substance Use Topics  . Smoking status: Current Every Day Smoker -- 1.00 packs/day    Types: Cigarettes  . Smokeless tobacco: Never Used  . Alcohol Use: Yes     Comment: socially   OB History   Grav Para Term Preterm Abortions TAB SAB Ect Mult Living                 Review of Systems  Skin: Positive for wound.       Laceration to the medial aspect of the right lower leg   Neurological: Positive for weakness.  Hematological: Bruises/bleeds easily.  All other systems reviewed and are negative.      Allergies  Ace inhibitors; Metronidazole; Duloxetine; Flexeril; and Metoprolol succinate  Home Medications   Current Outpatient Rx  Name  Route  Sig  Dispense  Refill  . hydrochlorothiazide (HYDRODIURIL) 25 MG tablet   Oral   Take 25 mg by mouth daily.         Marland Kitchen ibuprofen (ADVIL,MOTRIN) 600 MG tablet   Oral   Take 600 mg by mouth every 6 (six) hours as needed (Pain).         Marland Kitchen oxyCODONE-acetaminophen (PERCOCET/ROXICET) 5-325 MG per tablet   Oral   Take 1 tablet by mouth every 4 (four) hours as needed for severe pain.   20 tablet   0    BP 189/103  Pulse 73  Temp(Src) 98.4 F (36.9 C)  Resp 18  Ht 5\' 3"  (1.6 m)  Wt 245 lb 2 oz (111.188 kg)  BMI 43.43 kg/m2  SpO2 99% Physical Exam  Nursing note and vitals reviewed. Constitutional: She is oriented to person, place, and time. She appears well-developed and well-nourished. No distress.  HENT:  Head: Normocephalic and atraumatic.  Eyes: EOM are normal. Pupils are equal, round, and reactive to light.  Neck: Normal range of motion. Neck supple. No tracheal deviation present.  Cardiovascular: Normal rate.   Pulmonary/Chest: Effort normal. No respiratory distress.  Abdominal: Soft. She exhibits no distension.  Musculoskeletal: Normal range of motion.  Neurological: She is alert and oriented to person, place, and time.  Skin: Skin is warm and dry.  Deep 4 cm laceration to right medial lower leg   Psychiatric: She has a normal mood and affect. Her behavior is normal.    ED Course  Procedures (including critical care time) DIAGNOSTIC STUDIES: Oxygen Saturation is 99% on room air, normal by my interpretation.    COORDINATION OF CARE:  2:48 PM Discussed course of care with pt which includes suture repair. Pt understands and agrees. 2:49 PM LACERATION REPAIR Performed by: Gilman ButtnerBrittany J Andrews  Consent: Verbal consent obtained. Risks and benefits: risks, benefits and alternatives were discussed Patient identity confirmed: provided demographic data Time out performed prior to procedure Prepped and Draped in normal sterile fashion Wound explored Laceration Location: right medial lower leg Laceration Length: 4 cm No Foreign Bodies seen or palpated Anesthesia: local infiltration Local anesthetic: lidocaine 2% with epinephrine Anesthetic total: 3 cc Irrigation method: syringe Amount of cleaning: standard Skin closure:simple  Number of sutures or staples: 3 Technique: interrupted Patient tolerance: Patient tolerated the procedure well with no immediate complications.    Labs Review Labs Reviewed - No data to display Imaging Review No results found.   EKG Interpretation None      MDM   Final diagnoses:  Laceration of right lower leg    3:06 PM Patient's laceration closed loosely due to old laceration. Vitals stable and patient afebrile. Tdap up to date. Patient advised to keep wound clean and covered until it heals. Patient will go to her PCP in 10 days for suture removal.   I personally performed the services described in this documentation, which was scribed in my presence. The recorded information has been reviewed and is accurate.      Emilia BeckKaitlyn Antwain Caliendo, PA-C 05/02/13 1507

## 2013-05-08 ENCOUNTER — Encounter (HOSPITAL_COMMUNITY): Payer: Self-pay | Admitting: Emergency Medicine

## 2013-05-08 ENCOUNTER — Emergency Department (HOSPITAL_COMMUNITY)
Admission: EM | Admit: 2013-05-08 | Discharge: 2013-05-08 | Disposition: A | Discharge status: Home / Self Care | Payer: BC Managed Care – PPO | Attending: Emergency Medicine | Admitting: Emergency Medicine

## 2013-05-08 DIAGNOSIS — I10 Essential (primary) hypertension: Secondary | ICD-10-CM | POA: Insufficient documentation

## 2013-05-08 DIAGNOSIS — Z79899 Other long term (current) drug therapy: Secondary | ICD-10-CM | POA: Insufficient documentation

## 2013-05-08 DIAGNOSIS — Z792 Long term (current) use of antibiotics: Secondary | ICD-10-CM | POA: Insufficient documentation

## 2013-05-08 DIAGNOSIS — F17200 Nicotine dependence, unspecified, uncomplicated: Secondary | ICD-10-CM | POA: Insufficient documentation

## 2013-05-08 DIAGNOSIS — L089 Local infection of the skin and subcutaneous tissue, unspecified: Secondary | ICD-10-CM

## 2013-05-08 DIAGNOSIS — Y849 Medical procedure, unspecified as the cause of abnormal reaction of the patient, or of later complication, without mention of misadventure at the time of the procedure: Secondary | ICD-10-CM | POA: Insufficient documentation

## 2013-05-08 DIAGNOSIS — T148XXA Other injury of unspecified body region, initial encounter: Secondary | ICD-10-CM

## 2013-05-08 DIAGNOSIS — T8140XA Infection following a procedure, unspecified, initial encounter: Secondary | ICD-10-CM | POA: Insufficient documentation

## 2013-05-08 MED ORDER — CEPHALEXIN 500 MG PO CAPS: 500.0000 mg | ORAL_CAPSULE | Freq: Four times a day (QID) | ORAL | Status: DC

## 2013-05-08 NOTE — Discharge Instructions (Signed)
Wound Infection  A wound infection happens when a type of germ (bacteria) starts growing in the wound. In some cases, this can cause the wound to break open. If cared for properly, the infected wound will heal from the inside to the outside. Wound infections need treatment.  CAUSES  An infection is caused by bacteria growing in the wound.   SYMPTOMS    Increase in redness, swelling, or pain at the wound site.   Increase in drainage at the wound site.   Wound or bandage (dressing) starts to smell bad.   Fever.   Feeling tired or fatigued.   Pus draining from the wound.  TREATMENT   You caregiver will prescribe antibiotic medicine. The wound infection should improve within 24 to 48 hours. Any redness around the wound should stop spreading and the wound should be less painful.   HOME CARE INSTRUCTIONS    Only take over-the-counter or prescription medicines for pain, discomfort, or fever as directed by your caregiver.   Take your antibiotics as directed. Finish them even if you start to feel better.   Gently wash the area with mild soap and water 2 times a day, or as directed. Rinse off the soap. Pat the area dry with a clean towel. Do not rub the wound. This may cause bleeding.   Follow your caregiver's instructions for how often you need to change the dressing.   Apply ointment and a dressing to the wound as directed.   If the dressing sticks, moisten it with soapy water and gently remove it.   Change the bandage right away if it becomes wet, dirty, or develops a bad smell.   Take showers. Do not take tub baths, swim, or do anything that may soak the wound until it is healed.   Avoid exercises that make you sweat heavily.   Use anti-itch medicine as directed by your caregiver. The wound may itch when it is healing. Do not pick or scratch at the wound.   Follow up with your caregiver to get your wound rechecked as directed.  SEEK MEDICAL CARE IF:   You have an increase in swelling, pain, or redness  around the wound.   You have an increase in the amount of pus coming from the wound.   There is a bad smell coming from the wound.   More of the wound breaks open.   You have a fever.  MAKE SURE YOU:    Understand these instructions.   Will watch your condition.   Will get help right away if you are not doing well or get worse.  Document Released: 11/01/2002 Document Revised: 04/27/2011 Document Reviewed: 06/08/2010  [employer] Patient Information 2014 [employer].

## 2013-05-08 NOTE — ED Notes (Addendum)
PT had posterior R calf wound - [facility] Tuesday. PT noticed inflammation/infection Saturday and erythematous skin around wound today. Increased pain and redness to suture site. Denies being diabetic

## 2013-05-08 NOTE — ED Provider Notes (Signed)
CSN: [id number]     Arrival date & time 05/08/13  1559 History  This chart was scribed for non-physician practitioner Felicie Morn, NP working with Glynn Octave, MD by Valera Castle, ED scribe. This patient was seen in room TR10C/TR10C and the patient's care was started at 5:31 PM.   Chief Complaint  Patient presents with  . Wound Infection   (Consider location/radiation/quality/duration/timing/severity/associated sxs/prior Treatment) Patient is a 43 y.o. female presenting with wound check. The history is provided by the patient. No language interpreter was used.  Wound Check The current episode started 2 days ago. The problem has been gradually worsening. Associated symptoms comments: + for increasing redness and pain, chills, clamminess. - for fever. . Treatments tried: neosporin.   HPI Comments: KOREN PLYLER is a 42 y.o. female who presents to the Emergency Department complaining of a wound infection, onset 2 days ago. She states she had sutures placed for a lower right calf wound 6 days ago and has been noticing increasing redness and pain over the area over the last couple of days. She reports associated clamminess and chills. She had been applying neosporin to the wound. She has an appointment with her PCP in 3 days. She denies fever, and any other associated symptoms. She denies h/o DM.  PCP - [name], NP  Past Medical History  Diagnosis Date  . Hypertension   . Sleep apnea   . Migraine    Past Surgical History  Procedure Laterality Date  . Abdominal hysterectomy     Family History  Problem Relation Age of Onset  . Hypertension Mother   . Diabetes Mother   . Stroke Mother   . Heart failure Mother    History  Substance Use Topics  . Smoking status: Current Every Day Smoker -- 1.00 packs/day    Types: Cigarettes  . Smokeless tobacco: Never Used  . Alcohol Use: Yes     Comment: socially   OB History   Grav Para Term Preterm Abortions TAB SAB Ect Mult Living                  Review of Systems  Constitutional: Positive for chills. Negative for fever.  Skin: Positive for wound (sutured laceration over right calf, with redness).  All other systems reviewed and are negative.   Allergies  Ace inhibitors; Metronidazole; Duloxetine; Flexeril; and Metoprolol succinate  Home Medications   Current Outpatient Rx  Name  Route  Sig  Dispense  Refill  . amLODipine (NORVASC) 10 MG tablet   Oral   Take 10 tablets by mouth daily.         . baclofen (LIORESAL) 10 MG tablet   Oral   Take 10 mg by mouth daily.         . clindamycin (CLEOCIN) 2 % vaginal cream   Vaginal   Place 1 Applicatorful vaginally once.         . EPIPEN 2-PAK 0.3 MG/0.3ML SOAJ injection   Intramuscular   Inject 3 mLs into the muscle once.         . hydrochlorothiazide (HYDRODIURIL) 25 MG tablet   Oral   Take 25 mg by mouth daily.         Marland Kitchen ibuprofen (ADVIL,MOTRIN) 600 MG tablet   Oral   Take 600 mg by mouth every 6 (six) hours as needed (Pain).         Marland Kitchen oxyCODONE-acetaminophen (PERCOCET/ROXICET) 5-325 MG per tablet   Oral   Take 1  tablet by mouth every 4 (four) hours as needed for severe pain.   20 tablet   0   . PROAIR HFA 108 (90 BASE) MCG/ACT inhaler   Inhalation   Inhale 2 puffs into the lungs every 6 (six) hours.          BP 155/81  Pulse 104  Temp(Src) 98.6 F (37 C) (Oral)  Resp 20  Ht 5\' 3"  (1.6 m)  Wt 240 lb (108.863 kg)  BMI 42.52 kg/m2  SpO2 97%  Physical Exam  Nursing note and vitals reviewed. Constitutional: She is oriented to person, place, and time. She appears well-developed and well-nourished. No distress.  HENT:  Head: Normocephalic and atraumatic.  Eyes: EOM are normal.  Neck: Neck supple. No tracheal deviation present.  Cardiovascular: Normal rate.   Pulmonary/Chest: Effort normal. No respiratory distress.  Musculoskeletal: Normal range of motion.  Neurological: She is alert and oriented to person, place, and  time.  Skin: Skin is warm and dry. There is erythema.  Psychiatric: She has a normal mood and affect. Her behavior is normal.    ED Course  Procedures (including critical care time)  DIAGNOSTIC STUDIES: Oxygen Saturation is 97% on room air, normal by my interpretation.    COORDINATION OF CARE: 5:36 PM-Discussed treatment plan with pt at bedside and pt agreed to plan.   Labs Review Labs Reviewed - No data to display Imaging Review No results found.   EKG Interpretation None     Medications - No data to display MDM   Final diagnoses:  None    [facility]  Patient has continued to have serosanguinous drainage from wound.  Patient states wound had pus-like drainage over the weekend from wound.  Mild erythema surrounding wound edges.  No current purulent drainage noted.  Increased pain to area.  Patient is scheduled to see her PCP on Thursday for suture removal.  Will cover with keflex.  I personally performed the services described in this documentation, which was scribed in my presence. The recorded information has been reviewed and is accurate.   Jimmye Normanavid John Pamela Intrieri, NP 05/09/13 [phone]

## 2013-05-09 NOTE — ED Provider Notes (Signed)
Medical screening examination/treatment/procedure(s) were performed by non-physician practitioner and as supervising physician I was immediately available for consultation/collaboration.   EKG Interpretation None       Girlie Veltri, MD 05/09/13 1021 

## 2013-05-11 DIAGNOSIS — F329 Major depressive disorder, single episode, unspecified: Secondary | ICD-10-CM | POA: Insufficient documentation

## 2013-05-11 DIAGNOSIS — K5900 Constipation, unspecified: Secondary | ICD-10-CM | POA: Insufficient documentation

## 2013-05-11 DIAGNOSIS — G40909 Epilepsy, unspecified, not intractable, without status epilepticus: Secondary | ICD-10-CM | POA: Insufficient documentation

## 2013-05-11 DIAGNOSIS — F32A Depression, unspecified: Secondary | ICD-10-CM | POA: Insufficient documentation

## 2013-05-11 DIAGNOSIS — G43009 Migraine without aura, not intractable, without status migrainosus: Secondary | ICD-10-CM | POA: Insufficient documentation

## 2013-05-11 DIAGNOSIS — I10 Essential (primary) hypertension: Secondary | ICD-10-CM | POA: Insufficient documentation

## 2013-07-28 ENCOUNTER — Encounter (HOSPITAL_COMMUNITY): Payer: Self-pay | Admitting: Emergency Medicine

## 2013-07-28 ENCOUNTER — Emergency Department (HOSPITAL_COMMUNITY)
Admission: EM | Admit: 2013-07-28 | Discharge: 2013-07-28 | Disposition: A | Discharge status: Home / Self Care | Payer: BC Managed Care – PPO | Attending: Emergency Medicine | Admitting: Emergency Medicine

## 2013-07-28 ENCOUNTER — Emergency Department (HOSPITAL_COMMUNITY): Payer: BC Managed Care – PPO

## 2013-07-28 DIAGNOSIS — G43909 Migraine, unspecified, not intractable, without status migrainosus: Secondary | ICD-10-CM | POA: Insufficient documentation

## 2013-07-28 DIAGNOSIS — I10 Essential (primary) hypertension: Secondary | ICD-10-CM | POA: Insufficient documentation

## 2013-07-28 DIAGNOSIS — J45901 Unspecified asthma with (acute) exacerbation: Secondary | ICD-10-CM | POA: Insufficient documentation

## 2013-07-28 DIAGNOSIS — F17200 Nicotine dependence, unspecified, uncomplicated: Secondary | ICD-10-CM | POA: Insufficient documentation

## 2013-07-28 DIAGNOSIS — Z79899 Other long term (current) drug therapy: Secondary | ICD-10-CM | POA: Insufficient documentation

## 2013-07-28 DIAGNOSIS — J40 Bronchitis, not specified as acute or chronic: Secondary | ICD-10-CM

## 2013-07-28 DIAGNOSIS — J45909 Unspecified asthma, uncomplicated: Secondary | ICD-10-CM

## 2013-07-28 HISTORY — DX: Unspecified asthma, uncomplicated: J45.909

## 2013-07-28 MED ORDER — AZITHROMYCIN 250 MG PO TABS: ORAL_TABLET | ORAL | Status: DC

## 2013-07-28 MED ORDER — PREDNISONE 20 MG PO TABS
60.0000 mg | ORAL_TABLET | Freq: Once | ORAL | Status: AC
  Administered 2013-07-28: 60 mg via ORAL
  Filled 2013-07-28: qty 3

## 2013-07-28 MED ORDER — ALBUTEROL SULFATE (2.5 MG/3ML) 0.083% IN NEBU
5.0000 mg | INHALATION_SOLUTION | Freq: Once | RESPIRATORY_TRACT | Status: AC
  Administered 2013-07-28: 5 mg via RESPIRATORY_TRACT
  Filled 2013-07-28: qty 6

## 2013-07-28 MED ORDER — PREDNISONE 20 MG PO TABS: ORAL_TABLET | ORAL | Status: DC

## 2013-07-28 MED ORDER — ALBUTEROL SULFATE HFA 108 (90 BASE) MCG/ACT IN AERS
2.0000 | INHALATION_SPRAY | RESPIRATORY_TRACT | Status: DC | PRN
  Filled 2013-07-28: qty 6.7

## 2013-07-28 NOTE — ED Notes (Signed)
Pt completed updraft at this time, reports feeling no better.  Scattered wheezing noted.

## 2013-07-28 NOTE — ED Notes (Signed)
Initial Contact - pt resting on stretcher, placed to cardiac/02 monitor.  Pt reports cough/SOB.  Dry cough noted during assessment.  Pt reports prod yellow sputum.  Pt reports subjective fevers.  Reports c/o "pain in my sides from coughing so much".  Pt denies other complaints.  [facility]  Skin PWD.  MAEI.  Speaking full/clear sentences, LS dim throughout.  NAD.

## 2013-07-28 NOTE — ED Provider Notes (Signed)
CSN: [id number]     Arrival date & time 07/28/13  1505 History   First MD Initiated Contact with Patient 07/28/13 1535     Chief Complaint  Patient presents with  . Cough  . URI  . Shortness of Breath     (Consider location/radiation/quality/duration/timing/severity/associated sxs/prior Treatment) The history is provided by the patient.  Alicia Mosley is a 43 y.o. female hx of HTN, migraine, asthma, smoker here with cough, shortness of breath. She has been having productive cough with yellow sputum for the last week. She tried over the counter meds with no relief. Has some shortness of breath and chest tightness when she coughs. Denies fever or chills. Denies cardiac history. She still smokes about 1 pack per day.    Past Medical History  Diagnosis Date  . Hypertension   . Sleep apnea   . Migraine   . Asthma    Past Surgical History  Procedure Laterality Date  . Abdominal hysterectomy     Family History  Problem Relation Age of Onset  . Hypertension Mother   . Diabetes Mother   . Stroke Mother   . Heart failure Mother    History  Substance Use Topics  . Smoking status: Current Every Day Smoker -- 1.00 packs/day    Types: Cigarettes  . Smokeless tobacco: Never Used  . Alcohol Use: Yes     Comment: socially   OB History   Grav Para Term Preterm Abortions TAB SAB Ect Mult Living                 Review of Systems  Respiratory: Positive for cough and shortness of breath.   All other systems reviewed and are negative.     Allergies  Ace inhibitors; Metronidazole; Duloxetine; Flexeril; and Metoprolol succinate  Home Medications   Prior to Admission medications   Medication Sig Start Date End Date Taking? Authorizing Provider  albuterol (PROVENTIL HFA;VENTOLIN HFA) 108 (90 BASE) MCG/ACT inhaler Inhale 2 puffs into the lungs every 6 (six) hours as needed for wheezing or shortness of breath.   Yes Historical Provider, MD  amLODipine (NORVASC) 10 MG tablet Take  10 tablets by mouth daily. 05/04/13  Yes Historical Provider, MD  EPIPEN 2-PAK 0.3 MG/0.3ML SOAJ injection Inject 3 mLs into the muscle once. 05/05/13  Yes Historical Provider, MD  ibuprofen (ADVIL,MOTRIN) 600 MG tablet Take 600 mg by mouth every 6 (six) hours as needed (Pain).   Yes Historical Provider, MD  oxyCODONE-acetaminophen (PERCOCET/ROXICET) 5-325 MG per tablet Take 1 tablet by mouth every 4 (four) hours as needed for severe pain. 04/14/13  Yes Linwood DibblesJon Knapp, MD  triamterene-hydrochlorothiazide (DYAZIDE) 37.5-25 MG per capsule Take 1 capsule by mouth daily.   Yes Historical Provider, MD   BP 156/89  Pulse 97  Temp(Src) 98.3 F (36.8 C) (Oral)  Resp 20  SpO2 99% Physical Exam  Nursing note and vitals reviewed. Constitutional: She is oriented to person, place, and time.  Uncomfortable, coughing   HENT:  Head: Normocephalic.  Mouth/Throat: Oropharynx is clear and moist.  Eyes: Conjunctivae are normal. Pupils are equal, round, and reactive to light.  Neck: Normal range of motion. Neck supple.  Cardiovascular: Normal rate, regular rhythm and normal heart sounds.   Pulmonary/Chest:  Slightly tachypneic, mild diffuse wheezing on exam. No retractions. Talking in full sentences   Abdominal: Soft. Bowel sounds are normal. She exhibits no distension. There is no tenderness. There is no rebound and no guarding.  Musculoskeletal: Normal range of  motion. She exhibits no edema and no tenderness.  Neurological: She is alert and oriented to person, place, and time. No cranial nerve deficit. Coordination normal.  Skin: Skin is warm and dry.  Psychiatric: She has a normal mood and affect. Her behavior is normal. Judgment and thought content normal.    ED Course  Procedures (including critical care time) Labs Review Labs Reviewed - No data to display  Imaging Review Dg Chest 2 View  07/28/2013   CLINICAL DATA:  Cough. Chest congestion. Shortness of breath. Smoker. Current history of hypertension.   EXAM: CHEST  2 VIEW  COMPARISON:  Two-view chest x-ray 10/24/2006, 05/25/2005, 07/10/2003.  FINDINGS: Cardiac silhouette upper normal in size. Thoracic aorta minimally tortuous. Hilar and mediastinal contours otherwise unremarkable. Prominent bronchovascular markings diffusely and moderate central peribronchial thickening, more so than on the prior examinations. Lungs otherwise clear. No localized airspace consolidation. No pleural effusions. No pneumothorax. Normal pulmonary vascularity. Mild degenerative changes involving the thoracic spine.  IMPRESSION: Moderate changes of acute bronchitis and/or asthma without localized airspace pneumonia.   Electronically Signed   By: Hulan Saashomas  Lawrence M.D.   On: 07/28/2013 15:32     EKG Interpretation None      MDM   Final diagnoses:  None   Alicia Mosley is a 43 y.o. female here with cough, chest tightness. Likely asthma vs COPD (given that she is a smoker). I doubt ACS or PE or dissection. Will give nebs and reassess.   4:46 PM Minimal wheezing after 1 neb. Never hypoxic. Will d/c home on albuterol, prednisone. CXR showed no pneumonia but given that she is a smoker, will give zpack.   Richardean Canalavid H Brandonn Capelli, MD 07/28/13 [phone]

## 2013-07-28 NOTE — ED Notes (Signed)
Pt reports cold sxs: coughing, SOB and chest pain x 1.5 weeks.  Pt also reports coughing up yellowish sputum as well.

## 2013-07-28 NOTE — Discharge Instructions (Signed)
Use albuterol every 4 hrs for 2 days then as needed.   Take prednisone as prescribed.   Take zpack as prescribed.   Stop smoking.   Follow up with your doctor.  Return to ER if you have worse shortness of breath, chest pain.

## 2013-12-06 DIAGNOSIS — R928 Other abnormal and inconclusive findings on diagnostic imaging of breast: Secondary | ICD-10-CM | POA: Insufficient documentation

## 2014-05-06 ENCOUNTER — Emergency Department (HOSPITAL_COMMUNITY)
Admission: EM | Admit: 2014-05-06 | Discharge: 2014-05-07 | Disposition: A | Discharge status: Home / Self Care | Payer: BLUE CROSS/BLUE SHIELD | Attending: Emergency Medicine | Admitting: Emergency Medicine

## 2014-05-06 ENCOUNTER — Emergency Department (HOSPITAL_COMMUNITY): Payer: BLUE CROSS/BLUE SHIELD

## 2014-05-06 DIAGNOSIS — Z79899 Other long term (current) drug therapy: Secondary | ICD-10-CM | POA: Insufficient documentation

## 2014-05-06 DIAGNOSIS — G43909 Migraine, unspecified, not intractable, without status migrainosus: Secondary | ICD-10-CM | POA: Diagnosis not present

## 2014-05-06 DIAGNOSIS — S99912A Unspecified injury of left ankle, initial encounter: Secondary | ICD-10-CM | POA: Diagnosis present

## 2014-05-06 DIAGNOSIS — J45909 Unspecified asthma, uncomplicated: Secondary | ICD-10-CM | POA: Insufficient documentation

## 2014-05-06 DIAGNOSIS — Z720 Tobacco use: Secondary | ICD-10-CM | POA: Diagnosis not present

## 2014-05-06 DIAGNOSIS — Y9289 Other specified places as the place of occurrence of the external cause: Secondary | ICD-10-CM | POA: Diagnosis not present

## 2014-05-06 DIAGNOSIS — S82402A Unspecified fracture of shaft of left fibula, initial encounter for closed fracture: Secondary | ICD-10-CM

## 2014-05-06 DIAGNOSIS — I10 Essential (primary) hypertension: Secondary | ICD-10-CM | POA: Insufficient documentation

## 2014-05-06 DIAGNOSIS — Z7952 Long term (current) use of systemic steroids: Secondary | ICD-10-CM | POA: Insufficient documentation

## 2014-05-06 DIAGNOSIS — S82832A Other fracture of upper and lower end of left fibula, initial encounter for closed fracture: Secondary | ICD-10-CM | POA: Insufficient documentation

## 2014-05-06 DIAGNOSIS — Z8669 Personal history of other diseases of the nervous system and sense organs: Secondary | ICD-10-CM | POA: Diagnosis not present

## 2014-05-06 DIAGNOSIS — Y998 Other external cause status: Secondary | ICD-10-CM | POA: Diagnosis not present

## 2014-05-06 DIAGNOSIS — Y9301 Activity, walking, marching and hiking: Secondary | ICD-10-CM | POA: Insufficient documentation

## 2014-05-06 DIAGNOSIS — Z792 Long term (current) use of antibiotics: Secondary | ICD-10-CM | POA: Diagnosis not present

## 2014-05-06 DIAGNOSIS — W108XXA Fall (on) (from) other stairs and steps, initial encounter: Secondary | ICD-10-CM | POA: Insufficient documentation

## 2014-05-06 MED ORDER — IBUPROFEN 200 MG PO TABS
600.0000 mg | ORAL_TABLET | Freq: Once | ORAL | Status: AC
  Administered 2014-05-06: 600 mg via ORAL
  Filled 2014-05-06: qty 3

## 2014-05-06 NOTE — ED Notes (Signed)
Patient transported to X-ray 

## 2014-05-06 NOTE — ED Notes (Signed)
Returned from xray

## 2014-05-06 NOTE — ED Provider Notes (Signed)
CSN: [id number]     Arrival date & time 05/06/14  2332 History   First MD Initiated Contact with Patient 05/06/14 2336     This chart was scribed for non-physician practitioner, Earley FavorGail Treanna Dumler, FNP working with Marisa Severinlga Otter, MD by Arlan OrganAshley Leger, ED Scribe. This patient was seen in room WTR7/WTR7 and the patient's care was started at 11:42 PM.   Chief Complaint  Patient presents with  . Ankle Pain   The history is provided by the patient. No language interpreter was used.    HPI Comments: Alicia Mosley is a 44 y.o. female with a PMHx of HTN and asthma who presents to the Emergency Department complaining of a fall sustained just prior to arrival. Pt states her knee "gave out" on her as she was walking down her slippery wet steps resulting in her L foot inverting and her falling. She now c/o constant, moderate L ankle and foot pain. Ms. Avis EpleyYeldell states she is unable to ambulate and bear weight to the L foot secondary to pain. No OTC medications or home remedies taken prior to arrival. No recent fever or chills. No numbness or loss of sensation to foot. Pt with known allergies to Ace inhibitors, Metronidazole; Duloxetine, Flexeril, and Metoprolol succinate.  Past Medical History  Diagnosis Date  . Hypertension   . Sleep apnea   . Migraine   . Asthma    Past Surgical History  Procedure Laterality Date  . Abdominal hysterectomy     Family History  Problem Relation Age of Onset  . Hypertension Mother   . Diabetes Mother   . Stroke Mother   . Heart failure Mother    History  Substance Use Topics  . Smoking status: Current Every Day Smoker -- 1.00 packs/day    Types: Cigarettes  . Smokeless tobacco: Never Used  . Alcohol Use: Yes     Comment: socially   OB History    No data available     Review of Systems  Constitutional: Negative for fever and chills.  Musculoskeletal: Positive for joint swelling and arthralgias.  Neurological: Negative for weakness and numbness.       Allergies  Ace inhibitors; Metronidazole; Duloxetine; Flexeril; and Metoprolol succinate  Home Medications   Prior to Admission medications   Medication Sig Start Date End Date Taking? Authorizing Provider  albuterol (PROVENTIL HFA;VENTOLIN HFA) 108 (90 BASE) MCG/ACT inhaler Inhale 2 puffs into the lungs every 6 (six) hours as needed for wheezing or shortness of breath.   Yes Historical Provider, MD  amLODipine (NORVASC) 10 MG tablet Take 10 tablets by mouth daily. 05/04/13  Yes Historical Provider, MD  ibuprofen (ADVIL,MOTRIN) 600 MG tablet Take 600 mg by mouth every 6 (six) hours as needed (Pain).   Yes Historical Provider, MD  triamterene-hydrochlorothiazide (DYAZIDE) 37.5-25 MG per capsule Take 1 capsule by mouth daily.   Yes Historical Provider, MD  azithromycin (ZITHROMAX Z-PAK) 250 MG tablet 2 po day one, then 1 daily x 4 days Patient not taking: Reported on 05/06/2014 07/28/13   Richardean Canalavid H Yao, MD  EPIPEN 2-PAK 0.3 MG/0.3ML SOAJ injection Inject 3 mLs into the muscle once. 05/05/13   Historical Provider, MD  oxyCODONE-acetaminophen (PERCOCET/ROXICET) 5-325 MG per tablet Take 1 tablet by mouth every 4 (four) hours as needed for severe pain. Patient not taking: Reported on 05/06/2014 04/14/13   Linwood DibblesJon Knapp, MD  predniSONE (DELTASONE) 20 MG tablet Take 60 mg daily for 2 days then 40 mg daily for 2 days then 20  mg daily for 2 days. Patient not taking: Reported on 05/06/2014 07/28/13   Richardean Canal, MD  traMADol (ULTRAM) 50 MG tablet Take 1 tablet (50 mg total) by mouth every 6 (six) hours as needed. 05/07/14   Earley Favor, NP  Triage Vitals: BP 186/105 mmHg  Pulse 97  Temp(Src) 98.9 F (37.2 C) (Oral)  Resp 20  Ht  (1.626 m)  Wt 240 lb (108.863 kg)  BMI 41.18 kg/m2  SpO2 96%   Physical Exam  Constitutional: She is oriented to person, place, and time. She appears well-developed and well-nourished.  HENT:  Head: Normocephalic.  Eyes: EOM are normal.  Neck: Normal range of motion.   Pulmonary/Chest: Effort normal.  Abdominal: She exhibits no distension.  Musculoskeletal: She exhibits tenderness.       Left ankle: She exhibits decreased range of motion and swelling. She exhibits no ecchymosis, no deformity, no laceration and normal pulse. Tenderness.       Feet:  Neurological: She is alert and oriented to person, place, and time.  Skin: Skin is warm and dry. No erythema.  Psychiatric: She has a normal mood and affect.  Nursing note and vitals reviewed.   ED Course  Procedures (including critical care time)  DIAGNOSTIC STUDIES: Oxygen Saturation is 96% on RA, adequate by my interpretation.    COORDINATION OF CARE: 11:47 PM- Will order DG ankle complete L. Will give ibuprofen. Discussed treatment plan with pt at bedside and pt agreed to plan.     Labs Review Labs Reviewed - No data to display  Imaging Review Dg Ankle Complete Left  05/07/2014   CLINICAL DATA:  Larey Seat down 2 stairs tonight  EXAM: LEFT ANKLE COMPLETE - 3+ VIEW  COMPARISON:  None.  FINDINGS: There is an oblique distal fibular fracture, nondisplaced and well aligned. There is a chip fracture at the tip of the medial malleolus. The mortise is symmetric.  IMPRESSION: Fractures of the medial malleolar tip and distal fibula, nondisplaced and well aligned.   Electronically Signed   By: Ellery Plunk M.D.   On: 05/07/2014 00:06     EKG Interpretation None     Toe pink warm , pain decreased after splint placed  MDM   Final diagnoses:  Closed fibular fracture, left, initial encounter    I personally performed the services described in this documentation, which was scribed in my presence. The recorded information has been reviewed and is accurate.  Earley Favor, NP 05/07/14 1610  Marisa Severin, MD 05/07/14 [phone]

## 2014-05-07 ENCOUNTER — Encounter (HOSPITAL_COMMUNITY): Payer: Self-pay | Admitting: Emergency Medicine

## 2014-05-07 MED ORDER — TRAMADOL HCL 50 MG PO TABS: 50.0000 mg | ORAL_TABLET | Freq: Four times a day (QID) | ORAL | Status: DC | PRN

## 2014-05-07 NOTE — Discharge Instructions (Signed)
Cast or Splint Care Casts and splints support injured limbs and keep bones from moving while they heal.  HOME CARE  Keep the cast or splint uncovered during the drying period.  A plaster cast can take 24 to 48 hours to dry.  A fiberglass cast will dry in less than 1 hour.  Do not rest the cast on anything harder than a pillow for 24 hours.  Do not put weight on your injured limb. Do not put pressure on the cast. Wait for your doctor's approval.  Keep the cast or splint dry.  Cover the cast or splint with a plastic bag during baths or wet weather.  If you have a cast over your chest and belly (trunk), take sponge baths until the cast is taken off.  If your cast gets wet, dry it with a towel or blow dryer. Use the cool setting on the blow dryer.  Keep your cast or splint clean. Wash a dirty cast with a damp cloth.  Do not put any objects under your cast or splint.  Do not scratch the skin under the cast with an object. If itching is a problem, use a blow dryer on a cool setting over the itchy area.  Do not trim or cut your cast.  Do not take out the padding from inside your cast.  Exercise your joints near the cast as told by your doctor.  Raise (elevate) your injured limb on 1 or 2 pillows for the first 1 to 3 days. GET HELP IF:  Your cast or splint cracks.  Your cast or splint is too tight or too loose.  You itch badly under the cast.  Your cast gets wet or has a soft spot.  You have a bad smell coming from the cast.  You get an object stuck under the cast.  Your skin around the cast becomes red or sore.  You have new or more pain after the cast is put on. GET HELP RIGHT AWAY IF:  You have fluid leaking through the cast.  You cannot move your fingers or toes.  Your fingers or toes turn blue or white or are cool, painful, or puffy (swollen).  You have tingling or lose feeling (numbness) around the injured area.  You have bad pain or pressure under the  cast.  You have trouble breathing or have shortness of breath.  You have chest pain. Document Released: 06/04/2010 Document Revised: 10/05/2012 Document Reviewed: 08/11/2012 [facility] Patient Information 2015 [employer] [place]. This information is not intended to replace advice given to you by your health care provider. Make sure you discuss any questions you have with your health care provider. Please make an appointment with Dr. Magnus IvanBlackman for continued care

## 2014-05-07 NOTE — ED Notes (Signed)
Ortho tech in room 

## 2014-08-22 DIAGNOSIS — G4733 Obstructive sleep apnea (adult) (pediatric): Secondary | ICD-10-CM | POA: Insufficient documentation

## 2014-08-22 DIAGNOSIS — G44209 Tension-type headache, unspecified, not intractable: Secondary | ICD-10-CM | POA: Insufficient documentation

## 2015-02-05 ENCOUNTER — Emergency Department (HOSPITAL_COMMUNITY): Payer: BLUE CROSS/BLUE SHIELD

## 2015-02-05 ENCOUNTER — Emergency Department (HOSPITAL_COMMUNITY)
Admission: EM | Admit: 2015-02-05 | Discharge: 2015-02-06 | Disposition: A | Discharge status: Home / Self Care | Payer: BLUE CROSS/BLUE SHIELD | Attending: Emergency Medicine | Admitting: Emergency Medicine

## 2015-02-05 ENCOUNTER — Encounter (HOSPITAL_COMMUNITY): Payer: Self-pay | Admitting: *Deleted

## 2015-02-05 DIAGNOSIS — R05 Cough: Secondary | ICD-10-CM | POA: Diagnosis present

## 2015-02-05 DIAGNOSIS — G43909 Migraine, unspecified, not intractable, without status migrainosus: Secondary | ICD-10-CM | POA: Insufficient documentation

## 2015-02-05 DIAGNOSIS — J45909 Unspecified asthma, uncomplicated: Secondary | ICD-10-CM | POA: Diagnosis not present

## 2015-02-05 DIAGNOSIS — I10 Essential (primary) hypertension: Secondary | ICD-10-CM | POA: Diagnosis not present

## 2015-02-05 DIAGNOSIS — B9789 Other viral agents as the cause of diseases classified elsewhere: Secondary | ICD-10-CM

## 2015-02-05 DIAGNOSIS — J069 Acute upper respiratory infection, unspecified: Secondary | ICD-10-CM | POA: Insufficient documentation

## 2015-02-05 DIAGNOSIS — F17210 Nicotine dependence, cigarettes, uncomplicated: Secondary | ICD-10-CM | POA: Diagnosis not present

## 2015-02-05 DIAGNOSIS — R000 Tachycardia, unspecified: Secondary | ICD-10-CM | POA: Insufficient documentation

## 2015-02-05 DIAGNOSIS — Z79899 Other long term (current) drug therapy: Secondary | ICD-10-CM | POA: Insufficient documentation

## 2015-02-05 DIAGNOSIS — Z3202 Encounter for pregnancy test, result negative: Secondary | ICD-10-CM | POA: Insufficient documentation

## 2015-02-05 DIAGNOSIS — R079 Chest pain, unspecified: Secondary | ICD-10-CM | POA: Diagnosis not present

## 2015-02-05 LAB — COMPREHENSIVE METABOLIC PANEL
ALBUMIN: 4.2 g/dL (ref 3.5–5.0)
ALK PHOS: 75 U/L (ref 38–126)
ALT: 25 U/L (ref 14–54)
ANION GAP: 11 (ref 5–15)
AST: 25 U/L (ref 15–41)
BILIRUBIN TOTAL: 1 mg/dL (ref 0.3–1.2)
BUN: 7 mg/dL (ref 6–20)
CO2: 25 mmol/L (ref 22–32)
Calcium: 9.2 mg/dL (ref 8.9–10.3)
Chloride: 103 mmol/L (ref 101–111)
Creatinine, Ser: 0.75 mg/dL (ref 0.44–1.00)
GFR calc Af Amer: >60 mL/min (ref >60)
GFR calc non Af Amer: >60 mL/min (ref >60)
GLUCOSE: 114 mg/dL — AB (ref 65–99)
POTASSIUM: 3.7 mmol/L (ref 3.5–5.1)
SODIUM: 139 mmol/L (ref 135–145)
Total Protein: 8.1 g/dL (ref 6.5–8.1)

## 2015-02-05 LAB — CBC WITH DIFFERENTIAL/PLATELET
BASOS PCT: 0 %
Basophils Absolute: 0 10*3/uL (ref 0.0–0.1)
Eosinophils Absolute: 0.1 10*3/uL (ref 0.0–0.7)
Eosinophils Relative: 1 %
HEMATOCRIT: 47.5 % — AB (ref 36.0–46.0)
HEMOGLOBIN: 16.4 g/dL — AB (ref 12.0–15.0)
LYMPHS ABS: 1.7 10*3/uL (ref 0.7–4.0)
LYMPHS PCT: 26 %
MCH: 31.2 pg (ref 26.0–34.0)
MCHC: 34.5 g/dL (ref 30.0–36.0)
MCV: 90.5 fL (ref 78.0–100.0)
MONOS PCT: 9 %
Monocytes Absolute: 0.6 10*3/uL (ref 0.1–1.0)
NEUTROS ABS: 4.2 10*3/uL (ref 1.7–7.7)
Neutrophils Relative %: 64 %
Platelets: 62 10*3/uL — ABNORMAL LOW (ref 150–400)
RBC: 5.25 MIL/uL — ABNORMAL HIGH (ref 3.87–5.11)
RDW: 13.2 % (ref 11.5–15.5)
WBC: 6.6 10*3/uL (ref 4.0–10.5)

## 2015-02-05 LAB — URINALYSIS, ROUTINE W REFLEX MICROSCOPIC
GLUCOSE, UA: NEGATIVE mg/dL
KETONES UR: NEGATIVE mg/dL
Leukocytes, UA: NEGATIVE
Nitrite: NEGATIVE
PH: 5.5 (ref 5.0–8.0)
Protein, ur: 30 mg/dL — AB
SPECIFIC GRAVITY, URINE: 1.029 (ref 1.005–1.030)

## 2015-02-05 LAB — LIPASE, BLOOD: LIPASE: 22 U/L (ref 11–51)

## 2015-02-05 LAB — URINE MICROSCOPIC-ADD ON

## 2015-02-05 LAB — I-STAT CG4 LACTIC ACID, ED: Lactic Acid, Venous: 2.36 mmol/L (ref 0.5–2.0)

## 2015-02-05 LAB — I-STAT BETA HCG BLOOD, ED (MC, WL, AP ONLY): I-stat hCG, quantitative: <5 m[IU]/mL (ref <5)

## 2015-02-05 MED ORDER — KETOROLAC TROMETHAMINE 15 MG/ML IJ SOLN
15.0000 mg | Freq: Once | INTRAMUSCULAR | Status: AC
  Administered 2015-02-06: 15 mg via INTRAVENOUS
  Filled 2015-02-05: qty 1

## 2015-02-05 MED ORDER — SODIUM CHLORIDE 0.9 % IV BOLUS (SEPSIS)
1000.0000 mL | Freq: Once | INTRAVENOUS | Status: AC
  Administered 2015-02-05: 1000 mL via INTRAVENOUS

## 2015-02-05 MED ORDER — ONDANSETRON HCL 4 MG/2ML IJ SOLN: 4.0000 mg | Freq: Once | INTRAMUSCULAR | Status: DC | PRN

## 2015-02-05 NOTE — ED Provider Notes (Signed)
CSN: [id number]     Arrival date & time 02/05/15  1809 History   First MD Initiated Contact with Patient 02/05/15 2157     Chief Complaint  Patient presents with  . Cough  . Nausea  . Fever     (Consider location/radiation/quality/duration/timing/severity/associated sxs/prior Treatment) Patient is a 44 y.o. female presenting with cough and fever. The history is provided by the patient. No language interpreter was used.  Cough Cough characteristics:  Non-productive Severity:  Moderate Onset quality:  Gradual Duration:  3 days Timing:  Intermittent Progression:  Waxing and waning Chronicity:  New Smoker: yes   Associated symptoms: chest pain, ear pain, fever, myalgias and sinus congestion   Fever Associated symptoms: chest pain, cough, ear pain and myalgias     Past Medical History  Diagnosis Date  . Hypertension   . Sleep apnea   . Migraine   . Asthma    Past Surgical History  Procedure Laterality Date  . Abdominal hysterectomy     Family History  Problem Relation Age of Onset  . Hypertension Mother   . Diabetes Mother   . Stroke Mother   . Heart failure Mother    Social History  Substance Use Topics  . Smoking status: Current Every Day Smoker -- 1.00 packs/day    Types: Cigarettes  . Smokeless tobacco: Never Used  . Alcohol Use: Yes     Comment: socially   OB History    No data available     Review of Systems  Constitutional: Positive for fever.  HENT: Positive for ear pain.   Respiratory: Positive for cough.   Cardiovascular: Positive for chest pain.  Musculoskeletal: Positive for myalgias.  All other systems reviewed and are negative.     Allergies  Ace inhibitors; Metronidazole; Duloxetine; Flexeril; and Metoprolol succinate  Home Medications   Prior to Admission medications   Medication Sig Start Date End Date Taking? Authorizing Provider  albuterol (PROVENTIL HFA;VENTOLIN HFA) 108 (90 BASE) MCG/ACT inhaler Inhale 2 puffs into the lungs  every 6 (six) hours as needed for wheezing or shortness of breath.    Historical Provider, MD  amLODipine (NORVASC) 10 MG tablet Take 10 tablets by mouth daily. 05/04/13   Historical Provider, MD  azithromycin (ZITHROMAX Z-PAK) 250 MG tablet 2 po day one, then 1 daily x 4 days Patient not taking: Reported on 05/06/2014 07/28/13   Richardean Canal, MD  EPIPEN 2-PAK 0.3 MG/0.3ML SOAJ injection Inject 3 mLs into the muscle once. 05/05/13   Historical Provider, MD  ibuprofen (ADVIL,MOTRIN) 600 MG tablet Take 600 mg by mouth every 6 (six) hours as needed (Pain).    Historical Provider, MD  oxyCODONE-acetaminophen (PERCOCET/ROXICET) 5-325 MG per tablet Take 1 tablet by mouth every 4 (four) hours as needed for severe pain. Patient not taking: Reported on 05/06/2014 04/14/13   Linwood Dibbles, MD  predniSONE (DELTASONE) 20 MG tablet Take 60 mg daily for 2 days then 40 mg daily for 2 days then 20 mg daily for 2 days. Patient not taking: Reported on 05/06/2014 07/28/13   Richardean Canal, MD  traMADol (ULTRAM) 50 MG tablet Take 1 tablet (50 mg total) by mouth every 6 (six) hours as needed. 05/07/14   Earley Favor, NP  triamterene-hydrochlorothiazide (DYAZIDE) 37.5-25 MG per capsule Take 1 capsule by mouth daily.    Historical Provider, MD   BP 206/127 mmHg  Pulse 114  Temp(Src) 100.4 F (38 C) (Oral)  Resp 20  SpO2 95% Physical Exam  Constitutional: She is oriented to person, place, and time. She appears well-developed and well-nourished.  HENT:  Head: Atraumatic.  Mouth/Throat: No oropharyngeal exudate.  Eyes: Conjunctivae are normal.  Neck: Neck supple.  Cardiovascular: Tachycardia present.   Pulmonary/Chest: Effort normal. She has no wheezes.  Abdominal: Soft. Bowel sounds are normal. She exhibits no distension. There is no tenderness.  Musculoskeletal: She exhibits no edema or tenderness.  Lymphadenopathy:    She has no cervical adenopathy.  Neurological: She is alert and oriented to person, place, and time.  Skin:  Skin is warm and dry. No rash noted.  Psychiatric: She has a normal mood and affect.  Nursing note and vitals reviewed.   ED Course  Procedures (including critical care time) Labs Review Labs Reviewed  COMPREHENSIVE METABOLIC PANEL - Abnormal; Notable for the following:    Glucose, Bld 114 (*)    All other components within normal limits  CBC WITH DIFFERENTIAL/PLATELET - Abnormal; Notable for the following:    RBC 5.25 (*)    Hemoglobin 16.4 (*)    HCT 47.5 (*)    Platelets 62 (*)    All other components within normal limits  URINALYSIS, ROUTINE W REFLEX MICROSCOPIC (NOT AT [facility]) - Abnormal; Notable for the following:    Color, Urine AMBER (*)    APPearance CLOUDY (*)    Hgb urine dipstick TRACE (*)    Bilirubin Urine SMALL (*)    Protein, ur 30 (*)    All other components within normal limits  URINE MICROSCOPIC-ADD ON - Abnormal; Notable for the following:    Squamous Epithelial / LPF 6-30 (*)    Bacteria, UA FEW (*)    Crystals CA OXALATE CRYSTALS (*)    All other components within normal limits  I-STAT CG4 LACTIC ACID, ED - Abnormal; Notable for the following:    Lactic Acid, Venous 2.36 (*)    All other components within normal limits  URINE CULTURE  LIPASE, BLOOD  I-STAT BETA HCG BLOOD, ED ([facility], [facility], [facility] ONLY)  I-STAT CG4 LACTIC ACID, ED    Imaging Review Dg Chest 2 View  02/05/2015  CLINICAL DATA:  Cough and fever for 2 days EXAM: CHEST - 2 VIEW COMPARISON:  07/28/2013 FINDINGS: Cardiac shadow is within normal limits. The lungs are clear bilaterally. No acute bony abnormality is seen. IMPRESSION: No acute abnormality noted. Electronically Signed   By: Alcide CleverMark  Lukens M.D.   On: 02/05/2015 21:58   I have personally reviewed and evaluated these images and lab results as part of my medical decision-making.   EKG Interpretation None     Patient feels better after IV fluids and medication. Lactate returned to normal.  Patient with history of low platelet count.  No  excessive bleeding/bruising.  Patient to follow-up with her PCP to monitor MDM   Final diagnoses:  None    Viral URI with cough. Care instructions provided. Return precautions discussed.     Felicie Mornavid Leshae Mcclay, NP 02/06/15 [id number]  Arby BarretteMarcy Pfeiffer, MD 02/20/15 [phone]

## 2015-02-05 NOTE — ED Notes (Signed)
Pt complains of cough, nausea, nasal congestion, fever since Sunday. Pt hypertensive in triage, states she cannot take her BP medications due to nausea.

## 2015-02-06 LAB — I-STAT CG4 LACTIC ACID, ED: LACTIC ACID, VENOUS: 0.66 mmol/L (ref 0.5–2.0)

## 2015-02-06 MED ORDER — PSEUDOEPHEDRINE-DM-GG 30-10-100 MG/5ML PO LIQD: 10.0000 mL | Freq: Four times a day (QID) | ORAL | Status: DC | PRN

## 2015-02-06 MED ORDER — ONDANSETRON 4 MG PO TBDP: ORAL_TABLET | ORAL | Status: DC

## 2015-02-06 NOTE — Discharge Instructions (Signed)
Upper Respiratory Infection, Adult Most upper respiratory infections (URIs) are a viral infection of the air passages leading to the lungs. A URI affects the nose, throat, and upper air passages. The most common type of URI is nasopharyngitis and is typically referred to as "the common cold." URIs run their course and usually go away on their own. Most of the time, a URI does not require medical attention, but sometimes a bacterial infection in the upper airways can follow a viral infection. This is called a secondary infection. Sinus and middle ear infections are common types of secondary upper respiratory infections. Bacterial pneumonia can also complicate a URI. A URI can worsen asthma and chronic obstructive pulmonary disease (COPD). Sometimes, these complications can require emergency medical care and may be life threatening.  CAUSES Almost all URIs are caused by viruses. A virus is a type of germ and can spread from one person to another.  RISKS FACTORS You may be at risk for a URI if:   You smoke.   You have chronic heart or lung disease.  You have a weakened defense (immune) system.   You are very young or very old.   You have nasal allergies or asthma.  You work in crowded or poorly ventilated areas.  You work in health care facilities or schools. SIGNS AND SYMPTOMS  Symptoms typically develop 2-3 days after you come in contact with a cold virus. Most viral URIs last 7-10 days. However, viral URIs from the influenza virus (flu virus) can last 14-18 days and are typically more severe. Symptoms may include:   Runny or stuffy (congested) nose.   Sneezing.   Cough.   Sore throat.   Headache.   Fatigue.   Fever.   Loss of appetite.   Pain in your forehead, behind your eyes, and over your cheekbones (sinus pain).  Muscle aches.  DIAGNOSIS  Your health care provider may diagnose a URI by:  Physical exam.  Tests to check that your symptoms are not due to  another condition such as:  Strep throat.  Sinusitis.  Pneumonia.  Asthma. TREATMENT  A URI goes away on its own with time. It cannot be cured with medicines, but medicines may be prescribed or recommended to relieve symptoms. Medicines may help:  Reduce your fever.  Reduce your cough.  Relieve nasal congestion. HOME CARE INSTRUCTIONS   Take medicines only as directed by your health care provider.   Gargle warm saltwater or take cough drops to comfort your throat as directed by your health care provider.  Use a warm mist humidifier or inhale steam from a shower to increase air moisture. This may make it easier to breathe.  Drink enough fluid to keep your urine clear or pale yellow.   Eat soups and other clear broths and maintain good nutrition.   Rest as needed.   Return to work when your temperature has returned to normal or as your health care provider advises. You may need to stay home longer to avoid infecting others. You can also use a face mask and careful hand washing to prevent spread of the virus.  Increase the usage of your inhaler if you have asthma.   Do not use any tobacco products, including cigarettes, chewing tobacco, or electronic cigarettes. If you need help quitting, ask your health care provider. PREVENTION  The best way to protect yourself from getting a cold is to practice good hygiene.   Avoid oral or hand contact with people with cold   symptoms.   Wash your hands often if contact occurs.  There is no clear evidence that vitamin C, vitamin E, echinacea, or exercise reduces the chance of developing a cold. However, it is always recommended to get plenty of rest, exercise, and practice good nutrition.  SEEK MEDICAL CARE IF:   You are getting worse rather than better.   Your symptoms are not controlled by medicine.   You have chills.  You have worsening shortness of breath.  You have brown or red mucus.  You have yellow or brown nasal  discharge.  You have pain in your face, especially when you bend forward.  You have a fever.  You have swollen neck glands.  You have pain while swallowing.  You have white areas in the back of your throat. SEEK IMMEDIATE MEDICAL CARE IF:   You have severe or persistent:  Headache.  Ear pain.  Sinus pain.  Chest pain.  You have chronic lung disease and any of the following:  Wheezing.  Prolonged cough.  Coughing up blood.  A change in your usual mucus.  You have a stiff neck.  You have changes in your:  Vision.  Hearing.  Thinking.  Mood. MAKE SURE YOU:   Understand these instructions.  Will watch your condition.  Will get help right away if you are not doing well or get worse.   This information is not intended to replace advice given to you by your health care provider. Make sure you discuss any questions you have with your health care provider.   Document Released: 07/29/2000 Document Revised: 06/19/2014 Document Reviewed: 05/10/2013 Elsevier Interactive Patient Education 2016 [employer]  

## 2015-02-07 LAB — URINE CULTURE

## 2015-06-26 ENCOUNTER — Emergency Department (HOSPITAL_COMMUNITY)
Admission: EM | Admit: 2015-06-26 | Discharge: 2015-06-26 | Disposition: A | Discharge status: Home / Self Care | Payer: BLUE CROSS/BLUE SHIELD | Attending: Emergency Medicine | Admitting: Emergency Medicine

## 2015-06-26 ENCOUNTER — Encounter (HOSPITAL_COMMUNITY): Payer: Self-pay | Admitting: Emergency Medicine

## 2015-06-26 DIAGNOSIS — Z791 Long term (current) use of non-steroidal anti-inflammatories (NSAID): Secondary | ICD-10-CM | POA: Diagnosis not present

## 2015-06-26 DIAGNOSIS — Z792 Long term (current) use of antibiotics: Secondary | ICD-10-CM | POA: Insufficient documentation

## 2015-06-26 DIAGNOSIS — Z7952 Long term (current) use of systemic steroids: Secondary | ICD-10-CM | POA: Insufficient documentation

## 2015-06-26 DIAGNOSIS — J45909 Unspecified asthma, uncomplicated: Secondary | ICD-10-CM | POA: Diagnosis not present

## 2015-06-26 DIAGNOSIS — F17210 Nicotine dependence, cigarettes, uncomplicated: Secondary | ICD-10-CM | POA: Diagnosis not present

## 2015-06-26 DIAGNOSIS — Z79891 Long term (current) use of opiate analgesic: Secondary | ICD-10-CM | POA: Diagnosis not present

## 2015-06-26 DIAGNOSIS — Z79899 Other long term (current) drug therapy: Secondary | ICD-10-CM | POA: Diagnosis not present

## 2015-06-26 DIAGNOSIS — I10 Essential (primary) hypertension: Secondary | ICD-10-CM | POA: Diagnosis not present

## 2015-06-26 DIAGNOSIS — T7840XA Allergy, unspecified, initial encounter: Secondary | ICD-10-CM | POA: Diagnosis not present

## 2015-06-26 DIAGNOSIS — Z7951 Long term (current) use of inhaled steroids: Secondary | ICD-10-CM | POA: Diagnosis not present

## 2015-06-26 MED ORDER — DIPHENHYDRAMINE HCL 25 MG PO TABS: 25.0000 mg | ORAL_TABLET | Freq: Four times a day (QID) | ORAL | Status: DC

## 2015-06-26 MED ORDER — DIPHENHYDRAMINE HCL 50 MG/ML IJ SOLN
25.0000 mg | Freq: Once | INTRAMUSCULAR | Status: AC
  Administered 2015-06-26: 25 mg via INTRAVENOUS
  Filled 2015-06-26: qty 1

## 2015-06-26 MED ORDER — DIPHENHYDRAMINE HCL 50 MG/ML IJ SOLN
25.0000 mg | INTRAMUSCULAR | Status: AC
  Administered 2015-06-26: 25 mg via INTRAVENOUS
  Filled 2015-06-26: qty 1

## 2015-06-26 MED ORDER — PREDNISONE 10 MG (21) PO TBPK: 10.0000 mg | ORAL_TABLET | Freq: Every day | ORAL | Status: DC

## 2015-06-26 MED ORDER — METHYLPREDNISOLONE SODIUM SUCC 125 MG IJ SOLR
125.0000 mg | Freq: Once | INTRAMUSCULAR | Status: AC
  Administered 2015-06-26: 125 mg via INTRAVENOUS
  Filled 2015-06-26: qty 2

## 2015-06-26 MED ORDER — FAMOTIDINE 20 MG PO TABS: 20.0000 mg | ORAL_TABLET | Freq: Two times a day (BID) | ORAL | Status: DC

## 2015-06-26 MED ORDER — FAMOTIDINE IN NACL 20-0.9 MG/50ML-% IV SOLN
20.0000 mg | Freq: Once | INTRAVENOUS | Status: AC
  Administered 2015-06-26: 20 mg via INTRAVENOUS
  Filled 2015-06-26: qty 50

## 2015-06-26 NOTE — ED Provider Notes (Signed)
CSN: [id number]     Arrival date & time 06/26/15  0810 History   First MD Initiated Contact with Patient 06/26/15 [phone]     Chief Complaint  Patient presents with  . Allergic Reaction     (Consider location/radiation/quality/duration/timing/severity/associated sxs/prior Treatment) The history is provided by the patient and a relative.     Pt presents with pruritic rash that began around 6pm yesterday.  Took benadryl overnight but developed right sided throat discomfort this morning.  Denies difficulty swallowing or breathing, vomiting or diarrhea.  Has been taking an antibiotic recently, is on day 4 or 5.  Clindamycin.   Denies changes in personal care products including detergents, soaps, shampoos, lotions, perfumes. Denies new clothing or furniture.  Denies travel, visiting other people's houses.  Denies any recent camping or time spent in the woods.  Denies known tick bites.  Denies chemical or plant exposures.     Past Medical History  Diagnosis Date  . Hypertension   . Sleep apnea   . Migraine   . Asthma    Past Surgical History  Procedure Laterality Date  . Abdominal hysterectomy     Family History  Problem Relation Age of Onset  . Hypertension Mother   . Diabetes Mother   . Stroke Mother   . Heart failure Mother    Social History  Substance Use Topics  . Smoking status: Current Every Day Smoker -- 1.00 packs/day    Types: Cigarettes  . Smokeless tobacco: Never Used  . Alcohol Use: Yes     Comment: socially   OB History    No data available     Review of Systems  All other systems reviewed and are negative.     Allergies  Ace inhibitors; Metronidazole; Duloxetine; Flexeril; and Metoprolol succinate  Home Medications   Prior to Admission medications   Medication Sig Start Date End Date Taking? Authorizing Provider  albuterol (PROVENTIL HFA;VENTOLIN HFA) 108 (90 BASE) MCG/ACT inhaler Inhale 2 puffs into the lungs every 6 (six) hours as needed for wheezing  or shortness of breath.    Historical Provider, MD  amLODipine (NORVASC) 10 MG tablet Take 10 tablets by mouth daily. 05/04/13   Historical Provider, MD  atorvastatin (LIPITOR) 20 MG tablet Take 20 mg by mouth daily. 11/14/14 11/14/15  Historical Provider, MD  azithromycin (ZITHROMAX Z-PAK) 250 MG tablet 2 po day one, then 1 daily x 4 days Patient not taking: Reported on 05/06/2014 07/28/13   Richardean Canalavid H Yao, MD  EPIPEN 2-PAK 0.3 MG/0.3ML SOAJ injection Inject 3 mLs into the muscle once. 05/05/13   Historical Provider, MD  guaiFENesin (MUCINEX) 600 MG 12 hr tablet Take 600 mg by mouth 2 (two) times daily as needed for to loosen phlegm.    Historical Provider, MD  ibuprofen (ADVIL,MOTRIN) 600 MG tablet Take 600 mg by mouth every 6 (six) hours as needed (Pain).    Historical Provider, MD  Linaclotide Karlene Einstein(LINZESS) 145 MCG CAPS capsule Take 1 capsule by mouth daily. 01/16/15   Historical Provider, MD  meloxicam (MOBIC) 15 MG tablet Take 1 tablet by mouth daily. 01/24/15   Historical Provider, MD  ondansetron (ZOFRAN ODT) 4 MG disintegrating tablet 4mg  ODT q4 hours prn nausea/vomit 02/06/15   Felicie Mornavid Smith, NP  oxyCODONE-acetaminophen (PERCOCET/ROXICET) 5-325 MG per tablet Take 1 tablet by mouth every 4 (four) hours as needed for severe pain. Patient not taking: Reported on 05/06/2014 04/14/13   Linwood DibblesJon Knapp, MD  predniSONE (DELTASONE) 20 MG tablet Take 60 mg  daily for 2 days then 40 mg daily for 2 days then 20 mg daily for 2 days. Patient not taking: Reported on 05/06/2014 07/28/13   Richardean Canal, MD  pseudoephedrine-dextromethorphan-guaifenesin (ROBITUSSIN-PE) 30-10-100 MG/5ML solution Take 10 mLs by mouth 4 (four) times daily as needed for cough. 02/06/15   Felicie Morn, NP  traMADol (ULTRAM) 50 MG tablet Take 1 tablet (50 mg total) by mouth every 6 (six) hours as needed. Patient not taking: Reported on 02/05/2015 05/07/14   Earley Favor, NP  triamterene-hydrochlorothiazide (DYAZIDE) 37.5-25 MG per capsule Take 1 capsule by  mouth daily.    Historical Provider, MD   BP 120/92 mmHg  Pulse 95  Temp(Src) 97.8 F (36.6 C) (Oral)  Resp 18  Ht  (1.6 m)  Wt 119.296 kg  BMI 46.60 kg/m2  SpO2 92% Physical Exam  Constitutional: She appears well-developed and well-nourished. No distress.  HENT:  Head: Normocephalic and atraumatic.  Mouth/Throat: Oropharynx is clear and moist. No oropharyngeal exudate.  Neck: Neck supple.  Cardiovascular: Normal rate and regular rhythm.   Pulmonary/Chest: Effort normal and breath sounds normal. No respiratory distress. She has no wheezes. She has no rales.  Abdominal: Soft. She exhibits no distension. There is no tenderness. There is no rebound and no guarding.  Neurological: She is alert.  Skin: Rash (rasied erythematous rash over bilateral arms, back, anterior left upper leg ) noted. She is not diaphoretic.  Nursing note and vitals reviewed.   ED Course  Procedures (including critical care time) Labs Review Labs Reviewed - No data to display  Imaging Review No results found. I have personally reviewed and evaluated these images and lab results as part of my medical decision-making.   EKG Interpretation None       MDM   Final diagnoses:  Allergic reaction caused by a drug   Afebrile nontoxic patient with allergic reaction - pruritic rash and swelling and new right sided throat discomfort.  No stridor, tolerating oral secretions.  No GI symptoms.  Airway patent.  IV solu medrol, benadryl, pepcid given with great improvement.  Rash much improved, pt did not develop GI symptoms and throat symptoms completely resolved.  D/C home with prednisone, benadryl, pepcid.  Discussed strict return precautions.  Pt advised to include clindamycin on her allergy list for home (and has been added to EPIC).  Pt was being treated for bacterial vaginosis but denies any current symptoms.  Advised to stop treatment and follow up with gyn.  Discussed result, findings, treatment, and follow  up  with patient.  Pt given return precautions.  Pt verbalizes understanding and agrees with plan.       Trixie Dredge, PA-C 06/26/15 1405  Nelva Nay, MD 06/26/15 [phone]

## 2015-06-26 NOTE — Discharge Instructions (Signed)
Read the information below.  Use the prescribed medication as directed.  Please discuss all new medications with your pharmacist.  You may return to the Emergency Department at any time for worsening condition or any new symptoms that concern you.    If you develop itching or swelling in your mouth or throat or any difficulty swallowing or breathing, call 911 or return to the Emergency Department immediately for a recheck.    ° ° °Allergies °An allergy is an abnormal reaction to a substance by the body's defense system (immune system). Allergies can develop at any age. °WHAT CAUSES ALLERGIES? °An allergic reaction happens when the immune system mistakenly reacts to a normally harmless substance, called an allergen, as if it were harmful. The immune system releases antibodies to fight the substance. Antibodies eventually release a chemical called histamine into the bloodstream. The release of histamine is meant to protect the body from infection, but it also causes discomfort. °An allergic reaction can be triggered by: °· Eating an allergen. °· Inhaling an allergen. °· Touching an allergen. °WHAT TYPES OF ALLERGIES ARE THERE? °There are many types of allergies. Common types include: °· Seasonal allergies. People with this type of allergy are usually allergic to substances that are only present during certain seasons, such as molds and pollens. °· Food allergies. °· Drug allergies. °· Insect allergies. °· Animal dander allergies. °WHAT ARE SYMPTOMS OF ALLERGIES? °Possible allergy symptoms include: °· Swelling of the lips, face, tongue, mouth, or throat. °· Sneezing, coughing, or wheezing. °· Nasal congestion. °· Tingling in the mouth. °· Rash. °· Itching. °· Itchy, red, swollen areas of skin (hives). °· Watery eyes. °· Vomiting. °· Diarrhea. °· Dizziness. °· Lightheadedness. °· Fainting. °· Trouble breathing or swallowing. °· Chest tightness. °· Rapid heartbeat. °HOW ARE ALLERGIES DIAGNOSED? °Allergies are diagnosed  with a medical and family history and one or more of the following: °· Skin tests. °· Blood tests. °· A food diary. A food diary is a record of all the foods and drinks you have in a day and of all the symptoms you experience. °· The results of an elimination diet. An elimination diet involves eliminating foods from your diet and then adding them back in one by one to find out if a certain food causes an allergic reaction. °HOW ARE ALLERGIES TREATED? °There is no cure for allergies, but allergic reactions can be treated with medicine. Severe reactions usually need to be treated at a hospital. °HOW CAN REACTIONS BE PREVENTED? °The best way to prevent an allergic reaction is by avoiding the substance you are allergic to. Allergy shots and medicines can also help prevent reactions in some cases. People with severe allergic reactions may be able to prevent a life-threatening reaction called anaphylaxis with a medicine given right after exposure to the allergen. °  °This information is not intended to replace advice given to you by your health care provider. Make sure you discuss any questions you have with your health care provider. °  °Document Released: 04/28/2002 Document Revised: 02/23/2014 Document Reviewed: 11/14/2013 °Elsevier Interactive Patient Education ©2016 [employer] ° °

## 2015-06-26 NOTE — ED Notes (Signed)
Per Pharm tech, patient was recently on Clindamycin

## 2015-06-26 NOTE — ED Notes (Signed)
PT DISCHARGED. INSTRUCTIONS AND PRESCRIPTIONS GIVEN. AAOX3. PT IN NO APPARENT DISTRESS. THE OPPORTUNITY TO ASK QUESTIONS WAS PROVIDED. 

## 2015-06-26 NOTE — ED Notes (Addendum)
Pt reports generalized body rash/itching that began at 630pm last night. Pt reports now R side of throat feels funny and has dizziness and nausea. Unsure what caused reaction. Was started on an abx several days ago

## 2015-06-29 ENCOUNTER — Encounter (HOSPITAL_COMMUNITY): Payer: Self-pay | Admitting: Emergency Medicine

## 2015-06-29 ENCOUNTER — Emergency Department (HOSPITAL_COMMUNITY)
Admission: EM | Admit: 2015-06-29 | Discharge: 2015-06-29 | Disposition: A | Discharge status: Home / Self Care | Payer: BLUE CROSS/BLUE SHIELD | Attending: Emergency Medicine | Admitting: Emergency Medicine

## 2015-06-29 DIAGNOSIS — J45909 Unspecified asthma, uncomplicated: Secondary | ICD-10-CM | POA: Diagnosis not present

## 2015-06-29 DIAGNOSIS — F17210 Nicotine dependence, cigarettes, uncomplicated: Secondary | ICD-10-CM | POA: Diagnosis not present

## 2015-06-29 DIAGNOSIS — L299 Pruritus, unspecified: Secondary | ICD-10-CM | POA: Diagnosis not present

## 2015-06-29 DIAGNOSIS — I10 Essential (primary) hypertension: Secondary | ICD-10-CM | POA: Diagnosis not present

## 2015-06-29 DIAGNOSIS — Z8669 Personal history of other diseases of the nervous system and sense organs: Secondary | ICD-10-CM | POA: Diagnosis not present

## 2015-06-29 DIAGNOSIS — R000 Tachycardia, unspecified: Secondary | ICD-10-CM | POA: Diagnosis not present

## 2015-06-29 DIAGNOSIS — G43909 Migraine, unspecified, not intractable, without status migrainosus: Secondary | ICD-10-CM | POA: Diagnosis not present

## 2015-06-29 DIAGNOSIS — Z79899 Other long term (current) drug therapy: Secondary | ICD-10-CM | POA: Diagnosis not present

## 2015-06-29 LAB — COMPREHENSIVE METABOLIC PANEL
ALT: 28 U/L (ref 14–54)
ANION GAP: 10 (ref 5–15)
AST: 26 U/L (ref 15–41)
Albumin: 3.5 g/dL (ref 3.5–5.0)
Alkaline Phosphatase: 52 U/L (ref 38–126)
BUN: 18 mg/dL (ref 6–20)
CHLORIDE: 105 mmol/L (ref 101–111)
CO2: 26 mmol/L (ref 22–32)
Calcium: 8.4 mg/dL — ABNORMAL LOW (ref 8.9–10.3)
Creatinine, Ser: 0.77 mg/dL (ref 0.44–1.00)
GFR calc non Af Amer: >60 mL/min (ref >60)
Glucose, Bld: 114 mg/dL — ABNORMAL HIGH (ref 65–99)
POTASSIUM: 3.7 mmol/L (ref 3.5–5.1)
SODIUM: 141 mmol/L (ref 135–145)
Total Bilirubin: 0.6 mg/dL (ref 0.3–1.2)
Total Protein: 6.3 g/dL — ABNORMAL LOW (ref 6.5–8.1)

## 2015-06-29 LAB — CBC WITH DIFFERENTIAL/PLATELET
Basophils Absolute: 0 10*3/uL (ref 0.0–0.1)
Basophils Relative: 0 %
EOS ABS: 0 10*3/uL (ref 0.0–0.7)
EOS PCT: 0 %
HCT: 44.2 % (ref 36.0–46.0)
Hemoglobin: 15.8 g/dL — ABNORMAL HIGH (ref 12.0–15.0)
LYMPHS ABS: 2.8 10*3/uL (ref 0.7–4.0)
Lymphocytes Relative: 28 %
MCH: 32.2 pg (ref 26.0–34.0)
MCHC: 35.7 g/dL (ref 30.0–36.0)
MCV: 90 fL (ref 78.0–100.0)
MONOS PCT: 8 %
Monocytes Absolute: 0.8 10*3/uL (ref 0.1–1.0)
Neutro Abs: 6.4 10*3/uL (ref 1.7–7.7)
Neutrophils Relative %: 64 %
PLATELETS: 362 10*3/uL (ref 150–400)
RBC: 4.91 MIL/uL (ref 3.87–5.11)
RDW: 13.8 % (ref 11.5–15.5)
WBC: 10 10*3/uL (ref 4.0–10.5)

## 2015-06-29 MED ORDER — HYDROXYZINE HCL 25 MG PO TABS: 25.0000 mg | ORAL_TABLET | Freq: Four times a day (QID) | ORAL | Status: DC

## 2015-06-29 MED ORDER — HYDROXYZINE HCL 25 MG PO TABS
50.0000 mg | ORAL_TABLET | Freq: Once | ORAL | Status: AC
  Administered 2015-06-29: 50 mg via ORAL
  Filled 2015-06-29: qty 2

## 2015-06-29 MED ORDER — PERMETHRIN 5 % EX CREA: TOPICAL_CREAM | CUTANEOUS | Status: DC

## 2015-06-29 NOTE — Discharge Instructions (Signed)
Pruritus °Pruritus is an itching feeling. There are many different conditions and factors that can make your skin itchy. Dry skin is one of the most common causes of itching. Most cases of itching do not require medical attention. Itchy skin can turn into a rash.  °HOME CARE INSTRUCTIONS  °Watch your pruritus for any changes. Take these steps to help with your condition:  °Skin Care °· Moisturize your skin as needed. A moisturizer that contains petroleum jelly is best for keeping moisture in your skin. °· Take or apply medicines only as directed by your health care provider. This may include: °¨ Corticosteroid cream. °¨ Anti-itch lotions. °¨ Oral anti-histamines. °· Apply cool compresses to the affected areas. °· Try taking a bath with: °¨ Epsom salts. Follow the instructions on the packaging. You can get these at your local pharmacy or grocery store. °¨ Baking soda. Pour a small amount into the bath as directed by your health care provider. °¨ Colloidal oatmeal. Follow the instructions on the packaging. You can get this at your local pharmacy or grocery store. °· Try applying baking soda paste to your skin. Stir water into baking soda until it reaches a paste-like consistency.   °· Do not scratch your skin. °· Avoid hot showers or baths, which can make itching worse. A cold shower may help with itching as long as you use a moisturizer after. °· Avoid scented soaps, detergents, and perfumes. Use gentle soaps, detergents, perfumes, and other cosmetic products. °General Instructions °· Avoid wearing tight clothes. °· Keep a journal to help track what causes your itch. Write down: °¨ What you eat. °¨ What cosmetic products you use. °¨ What you drink. °¨ What you wear. This includes jewelry. °· Use a humidifier. This keeps the air moist, which helps to prevent dry skin. °SEEK MEDICAL CARE IF: °· The itching does not go away after several days. °· You sweat at night. °· You have weight loss. °· You are unusually  thirsty. °· You urinate more than normal. °· You are more tired than normal. °· You have abdominal pain. °· Your skin tingles. °· You feel weak. °· Your skin or the whites of your eyes look yellow (jaundice). °· Your skin feels numb. °  °This information is not intended to replace advice given to you by your health care provider. Make sure you discuss any questions you have with your health care provider. °  °Document Released: 10/15/2010 Document Revised: 06/19/2014 Document Reviewed: 01/29/2014 °Elsevier Interactive Patient Education ©2016 [employer] ° °

## 2015-06-29 NOTE — ED Notes (Signed)
Pt reports allergic reaction since Tuesday related to clindamycin use. Pt seen for same Wednesday; pt reports continued generalized swelling and itching since visit; denies SOB.

## 2015-06-29 NOTE — ED Provider Notes (Signed)
CSN: [id number]     Arrival date & time 06/29/15  1842 History   First MD Initiated Contact with Patient 06/29/15 1902     Chief Complaint  Patient presents with  . Allergic Reaction     (Consider location/radiation/quality/duration/timing/severity/associated sxs/prior Treatment) HPI   Blood pressure 157/91, pulse 102, temperature 97.7 F (36.5 C), temperature source Oral, resp. rate 16, SpO2 95 %.  Alicia Mosley is a 45 y.o. female complaining of Persistent pruritus onset this greater than 5 days ago. Patient was seen for similar 4 days ago, she was started on prednisone, Pepcid, Benadryl and she continues to have itching. The allergic reaction was thought to be secondary to clindamycin which she was taking for BV, she completed 4 days of a 7 day course, she denies any other new medications, detergents, soaps, pets. Nobody else in the household is affected, she denies nausea, vomiting, abdominal pain, jaundice. She feels that her hands and feet are more pruritic now than before and that her feet are painful. She denies rash.   Past Medical History  Diagnosis Date  . Hypertension   . Sleep apnea   . Migraine   . Asthma    Past Surgical History  Procedure Laterality Date  . Abdominal hysterectomy     Family History  Problem Relation Age of Onset  . Hypertension Mother   . Diabetes Mother   . Stroke Mother   . Heart failure Mother    Social History  Substance Use Topics  . Smoking status: Current Every Day Smoker -- 1.00 packs/day    Types: Cigarettes  . Smokeless tobacco: Never Used  . Alcohol Use: Yes     Comment: socially   OB History    No data available     Review of Systems  10 systems reviewed and found to be negative, except as noted in the HPI.   Allergies  Ace inhibitors; Metronidazole; Clindamycin/lincomycin; Duloxetine; Flexeril; and Metoprolol succinate  Home Medications   Prior to Admission medications   Medication Sig Start Date End Date  Taking? Authorizing Provider  albuterol (PROVENTIL HFA;VENTOLIN HFA) 108 (90 BASE) MCG/ACT inhaler Inhale 2 puffs into the lungs every 6 (six) hours as needed for wheezing or shortness of breath.   Yes Historical Provider, MD  amLODipine (NORVASC) 10 MG tablet Take 10 tablets by mouth daily. 05/04/13  Yes Historical Provider, MD  atorvastatin (LIPITOR) 20 MG tablet Take 20 mg by mouth daily. 11/14/14 11/14/15 Yes Historical Provider, MD  cloNIDine (CATAPRES) 0.1 MG tablet Take 0.1 mg by mouth daily. 05/01/15  Yes Historical Provider, MD  diphenhydrAMINE (BENADRYL) 25 MG tablet Take 1 tablet (25 mg total) by mouth every 6 (six) hours. X 3 days, then as needed Patient taking differently: Take 25-50 mg by mouth every 6 (six) hours. X 3 days, then as needed 06/26/15  Yes Trixie Dredge, PA-C  EPIPEN 2-PAK 0.3 MG/0.3ML SOAJ injection Inject 3 mLs into the muscle once. 05/05/13  Yes Historical Provider, MD  famotidine (PEPCID) 20 MG tablet Take 1 tablet (20 mg total) by mouth 2 (two) times daily. 06/26/15  Yes Trixie Dredge, PA-C  hydrocortisone cream 1 % Apply 1 application topically 4 (four) times daily as needed for itching.   Yes Historical Provider, MD  Linaclotide Karlene Einstein) 145 MCG CAPS capsule Take 1 capsule by mouth daily. 01/16/15  Yes Historical Provider, MD  meloxicam (MOBIC) 15 MG tablet Take 1 tablet by mouth daily as needed for pain.  01/24/15  Yes Historical  Provider, MD  predniSONE (STERAPRED UNI-PAK 21 TAB) 10 MG (21) TBPK tablet Take 1 tablet (10 mg total) by mouth daily. Day 1: take 6 tabs.  Day 2: 5 tabs  Day 3: 4 tabs  Day 4: 3 tabs  Day 5: 2 tabs  Day 6: 1 tab 06/26/15  Yes Trixie DredgeEmily West, PA-C  triamterene-hydrochlorothiazide (DYAZIDE) 37.5-25 MG per capsule Take 1 capsule by mouth daily.   Yes Historical Provider, MD  hydrOXYzine (ATARAX/VISTARIL) 25 MG tablet Take 1 tablet (25 mg total) by mouth every 6 (six) hours. 06/29/15   Rayan Ines, PA-C  permethrin (ELIMITE) 5 % cream Apply to entire body  other than face - let sit for 14 hours then wash off, may repeat in 1 week if still having symptoms 06/29/15   Joni ReiningNicole Melvenia Favela, PA-C   BP 152/87 mmHg  Pulse 80  Temp(Src) 98.1 F (36.7 C) (Oral)  Resp 19  SpO2 95% Physical Exam  Constitutional: She is oriented to person, place, and time. She appears well-developed and well-nourished. No distress.  HENT:  Head: Normocephalic and atraumatic.  Mouth/Throat: Oropharynx is clear and moist.  Eyes: Conjunctivae and EOM are normal. Pupils are equal, round, and reactive to light. No scleral icterus.  Cardiovascular: Regular rhythm and intact distal pulses.   Mild tachycardia  Pulmonary/Chest: Effort normal and breath sounds normal. No stridor. No respiratory distress. She has no wheezes. She has no rales. She exhibits no tenderness.  No stridor or drooling. No posterior pharynx edema, lip or tongue swelling. Pt reclining comfortably, speaking in complete sentences.   No Wheezing, excellent air movement in all fields.     Abdominal: Soft. She exhibits no distension. There is no tenderness. There is no rebound and no guarding.  Musculoskeletal: Normal range of motion.  Neurological: She is alert and oriented to person, place, and time.  Skin: No rash noted.  No rashes lesions, she has slightly excoriated areas on the bilateral upper extremities and lower extremities.  Psychiatric: She has a normal mood and affect.  Nursing note and vitals reviewed.   ED Course  Procedures (including critical care time) Labs Review Labs Reviewed  CBC WITH DIFFERENTIAL/PLATELET - Abnormal; Notable for the following:    Hemoglobin 15.8 (*)    All other components within normal limits  COMPREHENSIVE METABOLIC PANEL - Abnormal; Notable for the following:    Glucose, Bld 114 (*)    Calcium 8.4 (*)    Total Protein 6.3 (*)    All other components within normal limits    Imaging Review No results found. I have personally reviewed and evaluated these  images and lab results as part of my medical decision-making.   EKG Interpretation None      MDM   Final diagnoses:  Pruritus    Filed Vitals:   06/29/15 1846 06/29/15 2127  BP: 157/91 152/87  Pulse: 102 80  Temp: 97.7 F (36.5 C) 98.1 F (36.7 C)  TempSrc: Oral   Resp: 16 19  SpO2: 95% 95%    Medications  hydrOXYzine (ATARAX/VISTARIL) tablet 50 mg (50 mg Oral Given 06/29/15 1953)    Domenick Gongatasha D Lorenson is 45 y.o. female presenting with Persistent itching, states that she thinks it's likely an allergic reaction to clindamycin which she hasn't had in 5 days. There is been no other new environmental exposures. Patient is not jaundiced, she is being treated aggressively for allergic reaction with prednisone, Pepcid, Benadryl and topical hydrocortisone ointment. I don't see any hives or lesions, she  doesn't have any other sick contacts. States that the itching is worse in between the fingers and toes, will treat her for scabies, will check basic blood work looking for a hyperbilirubinemia versus any renal dysfunction. Patient will be referred for allergy testing and also give her prescription for Atarax.  Blood work with no significant abnormality, don't think this would be the source of her pruritus. Patient given referral to allergy testing and will treat for scabies, encouraged her to continue her treatment for allergic reaction.  Evaluation does not show pathology that would require ongoing emergent intervention or inpatient treatment. Pt is hemodynamically stable and mentating appropriately. Discussed findings and plan with patient/guardian, who agrees with care plan. All questions answered. Return precautions discussed and outpatient follow up given.   New Prescriptions   HYDROXYZINE (ATARAX/VISTARIL) 25 MG TABLET    Take 1 tablet (25 mg total) by mouth every 6 (six) hours.   PERMETHRIN (ELIMITE) 5 % CREAM    Apply to entire body other than face - let sit for 14 hours then wash  off, may repeat in 1 week if still having symptoms         Wynetta Emery, PA-C 06/29/15 2129  Tilden Fossa, MD 06/30/15 1557

## 2015-07-24 ENCOUNTER — Encounter: Payer: Self-pay | Admitting: Allergy and Immunology

## 2015-07-24 ENCOUNTER — Ambulatory Visit (INDEPENDENT_AMBULATORY_CARE_PROVIDER_SITE_OTHER): Payer: BLUE CROSS/BLUE SHIELD | Admitting: Allergy and Immunology

## 2015-07-24 VITALS — BP 140/100 | HR 85 | Temp 98.1°F | Resp 16 | Ht 62.6 in | Wt 262.8 lb

## 2015-07-24 DIAGNOSIS — J310 Chronic rhinitis: Secondary | ICD-10-CM | POA: Diagnosis not present

## 2015-07-24 DIAGNOSIS — R062 Wheezing: Secondary | ICD-10-CM | POA: Diagnosis not present

## 2015-07-24 DIAGNOSIS — R05 Cough: Secondary | ICD-10-CM | POA: Diagnosis not present

## 2015-07-24 DIAGNOSIS — Z720 Tobacco use: Secondary | ICD-10-CM

## 2015-07-24 DIAGNOSIS — R059 Cough, unspecified: Secondary | ICD-10-CM

## 2015-07-24 MED ORDER — FLUTICASONE PROPIONATE 50 MCG/ACT NA SUSP: NASAL | Status: DC

## 2015-07-24 MED ORDER — EPINEPHRINE 0.3 MG/0.3ML IJ SOAJ: 0.3000 mg | Freq: Once | INTRAMUSCULAR | Status: DC

## 2015-07-24 MED ORDER — MOMETASONE FURO-FORMOTEROL FUM 200-5 MCG/ACT IN AERO: 2.0000 | INHALATION_SPRAY | Freq: Two times a day (BID) | RESPIRATORY_TRACT | Status: DC

## 2015-07-24 MED ORDER — ALBUTEROL SULFATE HFA 108 (90 BASE) MCG/ACT IN AERS: 2.0000 | INHALATION_SPRAY | Freq: Four times a day (QID) | RESPIRATORY_TRACT | Status: DC | PRN

## 2015-07-24 NOTE — Progress Notes (Signed)
NEW PATIENT NOTE  RE: Alicia Mosley MRN: [id number] DOB: [birth year] [facility] [place] [address] [place] [address] Date of Office Visit: 07/24/2015  Dear Alicia Hansen, NP:  I had the pleasure of seeing Alicia Mosley today in initial evaluation, as you recall-- Subjective:  Alicia Mosley is a 45 y.o. female who presents today for Allergic Reaction  Assessment:   1. History of Cough and wheeze, in this current smoker, possible component of COPD/asthma.   2. Chronic rhinitis.    3.      Chronic tobacco use. 4.      Patient report of recent pruritus and swelling associated with clindamycin administration. 5.      Previous report of ACE inhibitor associated swelling. 6.      Overweight. 7.      Complex medical history on multiple medication regime. Plan:   Meds ordered this encounter  Medications  . fluticasone (FLONASE) 50 MCG/ACT nasal spray    Sig: Use 1-2 sprays daily as need for stuffy nose or drainage.    Dispense:  1 g    Refill:  3  . albuterol (VENTOLIN HFA) 108 (90 Base) MCG/ACT inhaler    Sig: Inhale 2 puffs into the lungs every 6 (six) hours as needed for wheezing or shortness of breath.    Dispense:  1 Inhaler    Refill:  1  . mometasone-formoterol (DULERA) 200-5 MCG/ACT AERO    Sig: Inhale 2 puffs into the lungs 2 (two) times daily.    Dispense:  1 Inhaler    Refill:  3  . EPINEPHrine 0.3 mg/0.3 mL IJ SOAJ injection    Sig: Inject 0.3 mLs (0.3 mg total) into the muscle once.    Dispense:  2 Device    Refill:  1  1. Avoidance: Clindamycin and Medications as previously discussed.    Decrease cig use and choose quit date. 2. Antihistamine: Zyrtec 10 mg by mouth once daily for runny nose or itching as needed         Hydroxyzine as needed per previous prescription. 3. Nasal Spray: Flonase 1-2 spray(s) each nostril once daily for stuffy nose or drainage.  4. Inhalers:  Rescue: Ventolin 2 puffs every 4 hours as needed for  cough or wheeze.       -May use 2 puffs 10-20 minutes prior to exercise.  Preventative: Dulera 2  puffs twice daily (Rinse, gargle, and spit out after use). 5. If any new skin episodes/rashes or swelling---take picture and write down environment, exposure, ingestion and activity.   Moisturize skin 2-4  Times daily--- Consider Vanicream, Cervae or Cetaphil. 6. Other: Epi-pen/Benadryl as needed.      Consider selected labs as discussed. 7. Nasal Saline wash each evening at shower time. 8. Follow up Visit: for skin testing off antihistamines (Zyrtec, Claritin, Benadryl, hydroxyzine, etc.) 72 hours prior to Visit.  HPI: Alicia Mosley presents to the office in initial evaluation of allergies.  She describes a 10 year history of rhinorrhea, congestion, sneezing and itchy watery eyes intermittently associated with cough and chest congestion.  There have been episodes of wheeze, which may be greater with pollen, outdoor or fluctuant weather pattern exposures.  In addition, during this timeframe, she describes recurrent dry skin without consistent pruritic hive or rash areas.  Only rarely is there mild eczema at her right wrist only, usually managed with moisturizer (prefers Jergens lotion, Dove body wash and Purex).    However, she has been  most recently concerned about medication triggering episodes, recently with antibiotics.  Approximately 5 years ago, she believes she had swelling with Ace inhibitors, 2 years ago swelling associated with Flexeril and metoprolol and in May 2017 during the course of clindamycin had prolonged difficulty itching, lightheadedness, throat irritation, dysphagia, cough and sensation of swelling.  She had several medical visits after about day 4 of the medication which was treated with prednisone, Pepcid and Benadryl.  She continues to have additional concern about swelling, but overall is improved.  No clear hives or further skin changes.  She has no restrictions to her diet and  only recalls tick bites as a child.    Medical History: Past Medical History  Diagnosis Date  . Hypertension   . Sleep apnea   . Migraine   . Asthma    Surgical History: Past Surgical History  Procedure Laterality Date  . Abdominal hysterectomy     Family History: Family History  Problem Relation Age of Onset  . Hypertension Mother   . Diabetes Mother   . Stroke Mother   . Heart failure Mother   . Allergic rhinitis Neg Hx   . Angioedema Neg Hx   . Asthma Neg Hx   . Eczema Neg Hx   . Immunodeficiency Neg Hx   . Urticaria Neg Hx    Social History: Social History  . Marital Status: Divorced    Spouse Name: N/A  . Number of Children: N/A  . Years of Education: N/A   Social History Main Topics  . Smoking status: Current Every Day Smoker -- 1.00 packs/day    Types: Cigarettes  . Smokeless tobacco: Never Used  . Alcohol Use: Yes     Comment: socially  . Drug Use: No  . Sexual Activity: Not on file   Social History Narrative    Alicia Mosley has a current medication list which includes the following prescription(s): albuterol, amlodipine, atorvastatin, epipen 2-pak, hydrocortisone cream, hydroxyzine, linaclotide, meloxicam, and triamterene-hydrochlorothiazide.   Drug Allergies: Allergies  Allergen Reactions  . Ace Inhibitors Swelling    REACTION: angioedema  . Lincomycin Hcl Hives and Swelling  . Metronidazole Anaphylaxis    REACTION: anaphylaxis  . Clindamycin     Throat swelling, hives  . Clindamycin/Lincomycin Hives and Swelling  . Duloxetine Itching and Swelling    angioedema  . Flexeril [Cyclobenzaprine] Other (See Comments)    Facial swelling. No problems with breathing  . Metoprolol Succinate Itching    REACTION: Itching   Environmental History: Alicia Mosley lives in a 45 year old house for 2 years with carpeted floors, with central heat and air; stuffed mattress, non-feather pillow/comforter, indoor dog without humidifier.   Review of Systems    Constitutional: Negative for fever, weight loss and malaise/fatigue.  HENT: Positive for congestion. Negative for ear pain, hearing loss, nosebleeds and sore throat.   Eyes: Negative for blurred vision, photophobia, pain, discharge and redness.       Corrective eyeglasses lenses for 15 years.  Respiratory: Positive for cough. Negative for shortness of breath.        Recalls bronchitis a few times a year.  Denies history of pneumonia.  Gastrointestinal: Negative for heartburn, nausea, vomiting, abdominal pain, diarrhea and constipation.  Genitourinary: Negative.   Musculoskeletal: Negative for myalgias.       History of arthritis.  Skin: Negative.  Negative for itching and rash.  Neurological: Positive for headaches. Negative for dizziness, seizures and weakness.  Endo/Heme/Allergies: Positive for environmental allergies.  Denies sensitivity to aspirin, NSAIDs, stinging insects, foods, latex, jewelry and cosmetics.  Immunological: No chronic or recurring infections. Objective:   Filed Vitals:   07/24/15 1351  BP: 140/100  Pulse: 85  Temp: 98.1 F (36.7 C)  Resp: 16   SpO2 Readings from Last 1 Encounters:  07/24/15 98%   Physical Exam  Constitutional: She is well-developed, well-nourished, and in no distress.  HENT:  Head: Atraumatic.  Right Ear: Tympanic membrane and ear canal normal.  Left Ear: Tympanic membrane and ear canal normal.  Nose: Mucosal edema present. No rhinorrhea. No epistaxis.  Mouth/Throat: Oropharynx is clear and moist and mucous membranes are normal. No oropharyngeal exudate, posterior oropharyngeal edema or posterior oropharyngeal erythema.  Eyes: Conjunctivae are normal.  Neck: Neck supple.  Cardiovascular: Normal rate, S1 normal and S2 normal.   No murmur heard. Pulmonary/Chest: Effort normal. She has no wheezes. She has no rhonchi. She has no rales.  Abdominal: Soft. Normal appearance and bowel sounds are normal.  Musculoskeletal: She exhibits no  edema.  Lymphadenopathy:    She has no cervical adenopathy.  Neurological: She is alert.  Skin: Skin is warm and intact. No rash noted. No cyanosis. Nails show no clubbing.  No acute rashes-- generalized xerosis.   Diagnostics: Spirometry:  FVC1.62--63%, FEV1 1.33--61%, postbronchodilator improvement FVC  1.85--72%,  FEV1 1.59--- 73%.   Skin testing:  Deferred secondary to recent antihistamine administration.    Khylei Wilms M. Willa RoughHicks, MD   cc: Alicia HansenJones, Penny L, NP

## 2015-07-24 NOTE — Patient Instructions (Addendum)
Take Home Sheet  1. Avoidance: Clindamycin and Medications as previously discussed.   Decrease cig Use and choose quit date.  2. Antihistamine: Zyrtec 10 mg by mouth once daily for runny nose or itching as needed    Hydroxyzine as needed per previous prescription.  3. Nasal Spray: Flonase 1-2 spray(s) each nostril once daily for stuffy nose or drainage.    4. Inhalers:  Rescue: Ventolin 2 puffs every 4 hours as needed for cough or wheeze.       -May use 2 puffs 10-20 minutes prior to exercise.   Preventative: Dulera 200mcg 2  puffs twice daily (Rinse, gargle, and spit out after use).   5. If any new skin episodes/rashes or swelling---take picture and write down environment, exposure, ingestion and activity.  Moisturize skin 2-4  Times daily--- Consider Vanicream, Cervae or Cetaphil.      6. Other: Epi-pen/Benadryl as needed.      Consider selected labs as discussed.   7. Nasal Saline wash each evening at shower time.   8. Follow up Visit: for skin testing off antihistamines (Zyrtec, Claritin, Benadryl, hydroxyzine, etc.) 72 hours prior to Visit.   Websites that have reliable Patient information: 1. American Academy of Asthma, Allergy, & Immunology: www.aaaai.org 2. [employer] Allergy [employer]: www.foodallergy.org 3. [facility] of Asthmatics: www.aanma.org 4. [facility]: https://www.strong.com/www.njc.org 5. American College of Allergy, Asthma, & Immunology: [employer].iswww.allergy.[employer].edu or www.acaai.org

## 2015-08-22 ENCOUNTER — Ambulatory Visit: Payer: BLUE CROSS/BLUE SHIELD | Admitting: Allergy and Immunology

## 2015-09-06 ENCOUNTER — Ambulatory Visit: Payer: BLUE CROSS/BLUE SHIELD | Admitting: Allergy and Immunology

## 2017-05-11 ENCOUNTER — Emergency Department (HOSPITAL_COMMUNITY)
Admission: EM | Admit: 2017-05-11 | Discharge: 2017-05-12 | Disposition: A | Discharge status: Home / Self Care | Payer: BLUE CROSS/BLUE SHIELD | Attending: Emergency Medicine | Admitting: Emergency Medicine

## 2017-05-11 ENCOUNTER — Other Ambulatory Visit: Payer: Self-pay

## 2017-05-11 ENCOUNTER — Encounter (HOSPITAL_COMMUNITY): Payer: Self-pay

## 2017-05-11 DIAGNOSIS — Z79899 Other long term (current) drug therapy: Secondary | ICD-10-CM | POA: Diagnosis not present

## 2017-05-11 DIAGNOSIS — J45909 Unspecified asthma, uncomplicated: Secondary | ICD-10-CM | POA: Diagnosis not present

## 2017-05-11 DIAGNOSIS — I10 Essential (primary) hypertension: Secondary | ICD-10-CM | POA: Diagnosis not present

## 2017-05-11 DIAGNOSIS — R200 Anesthesia of skin: Secondary | ICD-10-CM | POA: Diagnosis not present

## 2017-05-11 DIAGNOSIS — H538 Other visual disturbances: Secondary | ICD-10-CM | POA: Insufficient documentation

## 2017-05-11 DIAGNOSIS — F17210 Nicotine dependence, cigarettes, uncomplicated: Secondary | ICD-10-CM | POA: Diagnosis not present

## 2017-05-11 DIAGNOSIS — G43009 Migraine without aura, not intractable, without status migrainosus: Secondary | ICD-10-CM | POA: Insufficient documentation

## 2017-05-11 DIAGNOSIS — R51 Headache: Secondary | ICD-10-CM | POA: Diagnosis present

## 2017-05-11 NOTE — ED Triage Notes (Signed)
Pt reports a migraine headache that started 2 days ago. She states that the pain is in the left side her head around her temple area. She reports a history of migraines and HTN. She reports that she is recently recovering from the flu and strep throat and is still congested. A&Ox4.

## 2017-05-12 LAB — SEDIMENTATION RATE: SED RATE: 4 mm/h (ref 0–22)

## 2017-05-12 MED ORDER — CLONIDINE HCL 0.1 MG PO TABS
0.1000 mg | ORAL_TABLET | Freq: Once | ORAL | Status: AC
  Administered 2017-05-12: 0.1 mg via ORAL
  Filled 2017-05-12: qty 1

## 2017-05-12 MED ORDER — METHYLPREDNISOLONE SODIUM SUCC 125 MG IJ SOLR
125.0000 mg | Freq: Once | INTRAMUSCULAR | Status: AC
  Administered 2017-05-12: 125 mg via INTRAVENOUS
  Filled 2017-05-12: qty 2

## 2017-05-12 MED ORDER — MAGNESIUM SULFATE 2 GM/50ML IV SOLN
2.0000 g | Freq: Once | INTRAVENOUS | Status: AC
  Administered 2017-05-12: 2 g via INTRAVENOUS
  Filled 2017-05-12: qty 50

## 2017-05-12 MED ORDER — DIPHENHYDRAMINE HCL 50 MG/ML IJ SOLN
25.0000 mg | Freq: Once | INTRAMUSCULAR | Status: AC
  Administered 2017-05-12: 25 mg via INTRAVENOUS
  Filled 2017-05-12: qty 1

## 2017-05-12 MED ORDER — SODIUM CHLORIDE 0.9 % IV BOLUS
500.0000 mL | Freq: Once | INTRAVENOUS | Status: AC
  Administered 2017-05-12: 500 mL via INTRAVENOUS

## 2017-05-12 MED ORDER — METOCLOPRAMIDE HCL 5 MG/ML IJ SOLN
10.0000 mg | Freq: Once | INTRAMUSCULAR | Status: AC
  Administered 2017-05-12: 10 mg via INTRAVENOUS
  Filled 2017-05-12: qty 2

## 2017-05-12 MED ORDER — AMLODIPINE BESYLATE 5 MG PO TABS
10.0000 mg | ORAL_TABLET | Freq: Once | ORAL | Status: AC
  Administered 2017-05-12: 10 mg via ORAL
  Filled 2017-05-12: qty 2

## 2017-05-12 MED ORDER — SODIUM CHLORIDE 0.9 % IV BOLUS
1000.0000 mL | Freq: Once | INTRAVENOUS | Status: AC
Start: 2017-05-12 — End: 2017-05-12
  Administered 2017-05-12: 1000 mL via INTRAVENOUS

## 2017-05-12 MED ORDER — CHLORTHALIDONE 25 MG PO TABS
25.0000 mg | ORAL_TABLET | Freq: Once | ORAL | Status: AC
  Administered 2017-05-12: 25 mg via ORAL
  Filled 2017-05-12: qty 1

## 2017-05-12 NOTE — ED Provider Notes (Signed)
[facility]-EMERGENCY DEPT Provider Note   CSN: [id number] Arrival date & time: 05/11/17  1804  Time seen 12:20 AM   History   Chief Complaint Chief Complaint  Patient presents with  . Migraine  . Hypertension    HPI Alicia Mosley is a 47 y.o. female.  HPI patient states about 2 days ago she started getting a headache and points to her left temple.  She describes it as throbbing.  She denies any aura specifically no flashing lights or seeing spots.  She does state her vision seems blurred in the left corner of her eye.  She states she has numbness in her right foot which is "like a swelling tightness".  Not actual numbness.  She states she is able to ambulate without difficulty.  She states that coughing, bright lights and when she first wakes up and sits up makes the headache worse.  She states Tylenol was helping but it did not help today.  She states she has had migraines since she was 47 years old.  This one is slightly different and that it is in a different location and is lasting longer.  She states she has not been taking her blood pressure pills since the 21st but did take some today.  Patient states she has had the flu and strep about 9 days ago.  She was seen in urgent care and prescribed a cough syrup and amoxicillin.  She states her sore throat and cough are better.  PCP [name], NP   Past Medical History:  Diagnosis Date  . Asthma   . Hypertension   . Migraine   . Sleep apnea     Patient Active Problem List   Diagnosis Date Noted  . Headache, tension-type 08/22/2014  . Obstructive apnea 08/22/2014  . Abnormal finding on mammography 12/06/2013  . Benign essential HTN 05/11/2013  . CN (constipation) 05/11/2013  . Clinical depression 05/11/2013  . Epilepsy (HCC) 05/11/2013  . Migraine without aura and responsive to treatment 05/11/2013  . GERD 11/26/2008  . Gastro-esophageal reflux disease without esophagitis 11/26/2008  .  DEGENERATIVE JOINT DISEASE, RIGHT KNEE 08/15/2008  . Generalized OA 08/15/2008  . HYPERTENSION 09/29/2006  . BACK PAIN, CHRONIC 05/20/2006  . Back pain, chronic 05/20/2006  . OBESITY, NOS 04/15/2006  . ANXIETY 04/15/2006  . TOBACCO DEPENDENCE 04/15/2006  . MIGRAINE, UNSPEC., W/O INTRACTABLE MIGRAINE 04/15/2006  . RHINITIS, ALLERGIC 04/15/2006  . ASTHMA, INTERMITTENT 04/15/2006  . APNEA, SLEEP 04/15/2006  . Anxiety state 04/15/2006  . 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 ABDOMINAL HYSTERECTOMY       OB History   None      Home Medications    Patient is on amlodipine 10 mg daily, clonidine 0.1 mg daily and [place] 25 mg daily   Prior to Admission medications   Medication Sig Start Date End Date Taking? Authorizing Provider  albuterol (PROVENTIL HFA;VENTOLIN HFA) 108 (90 BASE) MCG/ACT inhaler Inhale 2 puffs into the lungs every 6 (six) hours as needed for wheezing or shortness of breath.   Yes [provider]  atorvastatin (LIPITOR) 20 MG tablet Take 20 mg by mouth daily. 11/14/14 05/12/17 Yes [provider]  chlorthalidone (HYGROTON) 25 MG tablet Take 25 mg by mouth daily.   Yes [provider]  cloNIDine (CATAPRES) 0.1 MG tablet Take 0.1 mg by mouth daily. 05/01/15  Yes [provider]  EPIPEN 2-PAK 0.3 MG/0.3ML  SOAJ injection Inject 3 mLs into the muscle once. 05/05/13  Yes [provider]  albuterol (VENTOLIN HFA) 108 (90 Base) MCG/ACT inhaler Inhale 2 puffs into the lungs every 6 (six) hours as needed for wheezing or shortness of breath. Patient not taking: Reported on 05/12/2017 07/24/15   Baxter Hire, MD  diphenhydrAMINE (BENADRYL) 25 MG tablet Take 1 tablet (25 mg total) by mouth every 6 (six) hours. X 3 days, then as needed Patient not taking: Reported on 07/24/2015 06/26/15   Trixie Dredge, PA-C  EPINEPHrine 0.3 mg/0.3 mL IJ SOAJ injection Inject 0.3 mLs (0.3 mg  total) into the muscle once. Patient not taking: Reported on 05/12/2017 07/24/15   Baxter Hire, MD  famotidine (PEPCID) 20 MG tablet Take 1 tablet (20 mg total) by mouth 2 (two) times daily. Patient not taking: Reported on 07/24/2015 06/26/15   Trixie Dredge, PA-C  fluticasone [facility]) 50 MCG/ACT nasal spray Use 1-2 sprays daily as need for stuffy nose or drainage. Patient not taking: Reported on 05/12/2017 07/24/15   Baxter Hire, MD  hydrOXYzine (ATARAX/VISTARIL) 25 MG tablet Take 1 tablet (25 mg total) by mouth every 6 (six) hours. Patient not taking: Reported on 05/12/2017 06/29/15   Pisciotta, Joni Reining, PA-C  mometasone-formoterol (DULERA) 200-5 MCG/ACT AERO Inhale 2 puffs into the lungs 2 (two) times daily. Patient not taking: Reported on 05/12/2017 07/24/15   Baxter Hire, MD  permethrin (ELIMITE) 5 % cream Apply to entire body other than face - let sit for 14 hours then wash off, may repeat in 1 week if still having symptoms Patient not taking: Reported on 05/12/2017 06/29/15   Pisciotta, Joni Reining, PA-C  predniSONE (STERAPRED UNI-PAK 21 TAB) 10 MG (21) TBPK tablet Take 1 tablet (10 mg total) by mouth daily. Day 1: take 6 tabs.  Day 2: 5 tabs  Day 3: 4 tabs  Day 4: 3 tabs  Day 5: 2 tabs  Day 6: 1 tab Patient not taking: Reported on 07/24/2015 06/26/15   Trixie Dredge, PA-C    Family History Family History  Problem Relation Age of Onset  . Hypertension Mother   . Diabetes Mother   . Stroke Mother   . Heart failure Mother   . Allergic rhinitis Neg Hx   . Angioedema Neg Hx   . Asthma Neg Hx   . Eczema Neg Hx   . Immunodeficiency Neg Hx   . Urticaria Neg Hx     Social History Social History   Tobacco Use  . Smoking status: Current Every Day Smoker    Packs/day: 1.00    Types: Cigarettes  . Smokeless tobacco: Never Used  Substance Use Topics  . Alcohol use: Yes    Comment: socially  . Drug use: No  employed   Allergies   Ace inhibitors; Lincomycin hcl; Metronidazole; Clindamycin;  Clindamycin/lincomycin; Duloxetine; Flexeril [cyclobenzaprine]; and Metoprolol succinate   Review of Systems Review of Systems  All other systems reviewed and are negative.    Physical Exam Updated Vital Signs BP (!) 172/93 Comment: informed the nurse  Pulse 78   Temp 99.1 F (37.3 C) (Oral)   Resp 18   Ht 5\' 3"  (1.6 m)   Wt 120.6 kg (265 lb 12.8 oz)   SpO2 94%   BMI 47.08 kg/m   Physical Exam  Constitutional: She is oriented to person, place, and time. She appears well-developed and well-nourished.  Non-toxic appearance. She does not appear ill. No distress.  HENT:  Head: Normocephalic and  atraumatic.    Right Ear: External ear normal.  Left Ear: External ear normal.  Nose: Nose normal. No mucosal edema or rhinorrhea.  Mouth/Throat: Oropharynx is clear and moist and mucous membranes are normal. No dental abscesses or uvula swelling.  Patient points to her left temple as the etiology of her pain.  I do not obviously feel her temporal artery however she is tender in that area.  Eyes: Pupils are equal, round, and reactive to light. Conjunctivae and EOM are normal.  Neck: Normal range of motion and full passive range of motion without pain. Neck supple.  Cardiovascular: Normal rate, regular rhythm and normal heart sounds. Exam reveals no gallop and no friction rub.  No murmur heard. Pulmonary/Chest: Effort normal and breath sounds normal. No respiratory distress. She has no wheezes. She has no rhonchi. She has no rales. She exhibits no tenderness and no crepitus.  Abdominal: Soft. Normal appearance and bowel sounds are normal. She exhibits no distension. There is no tenderness. There is no rebound and no guarding.  Musculoskeletal: Normal range of motion. She exhibits no edema or tenderness.  Moves all extremities well.   Neurological: She is alert and oriented to person, place, and time. She has normal strength. No cranial nerve deficit.  Skin: Skin is warm, dry and intact. No  rash noted. No erythema. No pallor.  Psychiatric: She has a normal mood and affect. Her speech is normal and behavior is normal. Her mood appears not anxious.  Nursing note and vitals reviewed.    ED Treatments / Results  Labs (all labs ordered are listed, but only abnormal results are displayed) Results for orders placed or performed during the hospital encounter of 05/11/17  Sedimentation rate  Result Value Ref Range   Sed Rate 4 0 - 22 mm/hr   Laboratory interpretation all normal    EKG None  Radiology No results found.  Procedures Procedures (including critical care time)  Medications Ordered in ED Medications  amLODipine (NORVASC) tablet 10 mg (10 mg Oral Given 05/12/17 0105)  cloNIDine (CATAPRES) tablet 0.1 mg (0.1 mg Oral Given 05/12/17 0105)  sodium chloride 0.9 % bolus 1,000 mL (0 mLs Intravenous Stopped 05/12/17 0433)  sodium chloride 0.9 % bolus 500 mL (500 mLs Intravenous New Bag/Given 05/12/17 0101)  metoCLOPramide (REGLAN) injection 10 mg (10 mg Intravenous Given 05/12/17 0104)  diphenhydrAMINE (BENADRYL) injection 25 mg (25 mg Intravenous Given 05/12/17 0101)  methylPREDNISolone sodium succinate (SOLU-MEDROL) 125 mg/2 mL injection 125 mg (125 mg Intravenous Given 05/12/17 0101)  chlorthalidone (HYGROTON) tablet 25 mg (25 mg Oral Given 05/12/17 0114)  magnesium sulfate IVPB 2 g 50 mL (2 g Intravenous New Bag/Given 05/12/17 0342)     Initial Impression / Assessment and Plan / ED Course  I have reviewed the triage vital signs and the nursing notes.  Pertinent labs & imaging results that were available during my care of the patient were reviewed by me and considered in my medical decision making (see chart for details).     Patient was given migraine cocktail for what sounds like a migraine headache.  A sed rate was done to rule out temporal arteritis.  She was given her usual dose of her 3 blood pressure medications.  Recheck at 3:20 AM patient states her headache  is much improved and almost gone.  We discussed her sed rate results.  She was given IV magnesium.  Her blood pressure improved from 182/119 to currently 158/85.  5:30 AM patient is feeling much  improved, she feels ready to be discharged.  Final Clinical Impressions(s) / ED Diagnoses   Final diagnoses:  Migraine without aura and without status migrainosus, not intractable  Essential hypertension    ED Discharge Orders    None     Plan discharge  Devoria Albe, MD, Concha Pyo, MD 05/12/17 [phone]

## 2017-05-12 NOTE — Discharge Instructions (Signed)
Take your blood pressure medication every day, including today. Look at the recurrent migraine headache information to see if there are things you can do to prevent headaches.  Recheck as needed.

## 2017-05-15 ENCOUNTER — Encounter (HOSPITAL_COMMUNITY): Payer: Self-pay

## 2017-05-15 ENCOUNTER — Emergency Department (HOSPITAL_COMMUNITY): Payer: BLUE CROSS/BLUE SHIELD

## 2017-05-15 ENCOUNTER — Emergency Department (HOSPITAL_COMMUNITY)
Admission: EM | Admit: 2017-05-15 | Discharge: 2017-05-15 | Disposition: A | Discharge status: Home / Self Care | Payer: BLUE CROSS/BLUE SHIELD | Attending: Emergency Medicine | Admitting: Emergency Medicine

## 2017-05-15 DIAGNOSIS — F17210 Nicotine dependence, cigarettes, uncomplicated: Secondary | ICD-10-CM | POA: Diagnosis not present

## 2017-05-15 DIAGNOSIS — Z79899 Other long term (current) drug therapy: Secondary | ICD-10-CM | POA: Diagnosis not present

## 2017-05-15 DIAGNOSIS — J4520 Mild intermittent asthma, uncomplicated: Secondary | ICD-10-CM | POA: Diagnosis not present

## 2017-05-15 DIAGNOSIS — I10 Essential (primary) hypertension: Secondary | ICD-10-CM | POA: Diagnosis not present

## 2017-05-15 DIAGNOSIS — R51 Headache: Secondary | ICD-10-CM | POA: Diagnosis not present

## 2017-05-15 DIAGNOSIS — R519 Headache, unspecified: Secondary | ICD-10-CM

## 2017-05-15 LAB — I-STAT CHEM 8, ED
BUN: 14 mg/dL (ref 6–20)
CALCIUM ION: 1.11 mmol/L — AB (ref 1.15–1.40)
Chloride: 96 mmol/L — ABNORMAL LOW (ref 101–111)
Creatinine, Ser: 0.7 mg/dL (ref 0.44–1.00)
Glucose, Bld: 121 mg/dL — ABNORMAL HIGH (ref 65–99)
HEMATOCRIT: 49 % — AB (ref 36.0–46.0)
HEMOGLOBIN: 16.7 g/dL — AB (ref 12.0–15.0)
Potassium: 3.3 mmol/L — ABNORMAL LOW (ref 3.5–5.1)
SODIUM: 138 mmol/L (ref 135–145)
TCO2: 33 mmol/L — ABNORMAL HIGH (ref 22–32)

## 2017-05-15 LAB — I-STAT BETA HCG BLOOD, ED (MC, WL, AP ONLY)

## 2017-05-15 MED ORDER — VALPROATE SODIUM 500 MG/5ML IV SOLN
500.0000 mg | Freq: Once | INTRAVENOUS | Status: AC
  Administered 2017-05-15: 500 mg via INTRAVENOUS
  Filled 2017-05-15: qty 5

## 2017-05-15 MED ORDER — VALPROATE SODIUM 500 MG/5ML IV SOLN
500.0000 mg | Freq: Once | INTRAVENOUS | Status: DC
  Filled 2017-05-15: qty 5

## 2017-05-15 MED ORDER — KETOROLAC TROMETHAMINE 15 MG/ML IJ SOLN
15.0000 mg | Freq: Once | INTRAMUSCULAR | Status: AC
  Administered 2017-05-15: 15 mg via INTRAVENOUS
  Filled 2017-05-15: qty 1

## 2017-05-15 MED ORDER — DIPHENHYDRAMINE HCL 50 MG/ML IJ SOLN
25.0000 mg | Freq: Once | INTRAMUSCULAR | Status: AC
  Administered 2017-05-15: 25 mg via INTRAVENOUS
  Filled 2017-05-15: qty 1

## 2017-05-15 MED ORDER — SODIUM CHLORIDE 0.9 % IV BOLUS: 1000.0000 mL | Freq: Once | INTRAVENOUS | Status: DC

## 2017-05-15 MED ORDER — SODIUM CHLORIDE 0.9 % IV BOLUS
1000.0000 mL | Freq: Once | INTRAVENOUS | Status: AC
  Administered 2017-05-15: 1000 mL via INTRAVENOUS

## 2017-05-15 MED ORDER — DEXAMETHASONE SODIUM PHOSPHATE 10 MG/ML IJ SOLN
10.0000 mg | Freq: Once | INTRAMUSCULAR | Status: AC
  Administered 2017-05-15: 10 mg via INTRAVENOUS
  Filled 2017-05-15: qty 1

## 2017-05-15 MED ORDER — POTASSIUM CHLORIDE CRYS ER 20 MEQ PO TBCR
40.0000 meq | EXTENDED_RELEASE_TABLET | Freq: Once | ORAL | Status: AC
  Administered 2017-05-15: 40 meq via ORAL
  Filled 2017-05-15: qty 2

## 2017-05-15 MED ORDER — BUTALBITAL-APAP-CAFFEINE 50-325-40 MG PO TABS: 1.0000 | ORAL_TABLET | Freq: Four times a day (QID) | ORAL | 0 refills | Status: AC | PRN

## 2017-05-15 MED ORDER — PROCHLORPERAZINE EDISYLATE 5 MG/ML IJ SOLN
10.0000 mg | Freq: Once | INTRAMUSCULAR | Status: AC
  Administered 2017-05-15: 10 mg via INTRAVENOUS
  Filled 2017-05-15: qty 2

## 2017-05-15 NOTE — ED Provider Notes (Signed)
[facility] EMERGENCY DEPARTMENT Provider Note   CSN: [id number] Arrival date & time: 05/15/17  8341     History   Chief Complaint No chief complaint on file.   HPI Alicia Mosley is a 47 y.o. female.  HPI   47 yo F with well documented h/o asthma, HTN, OSA, chronic migraines here with severe headache. Pt reports that over the past 1 week, she's had daily aching, throbbing, L temporal headaches. She's had associated intermittent blurred vision. H/o migraines with similar sx but this has been more persistent. Sx did start in setting of taking Amox for recent strep throat, but has not had any fever, chills, sinus congestion, facial swelling, or ear pain. No neck pain or stiffness. HA worse with palpation of temporal area. Relieved with ibuprofen but sx then return. No other medical complaints. No focal numbness or weakness.  Per records, pt just seen on 3/26 for similar sx. Had ESR that was negative, making temporal arteritis unlikely. Well-documented h/o similar complaints.  Past Medical History:  Diagnosis Date  . Asthma   . Hypertension   . Migraine   . Sleep apnea     Patient Active Problem List   Diagnosis Date Noted  . Headache, tension-type 08/22/2014  . Obstructive apnea 08/22/2014  . Abnormal finding on mammography 12/06/2013  . Benign essential HTN 05/11/2013  . CN (constipation) 05/11/2013  . Clinical depression 05/11/2013  . Epilepsy ([place]) 05/11/2013  . Migraine without aura and responsive to treatment 05/11/2013  . GERD 11/26/2008  . Gastro-esophageal reflux disease without esophagitis 11/26/2008  . DEGENERATIVE JOINT DISEASE, RIGHT KNEE 08/15/2008  . Generalized OA 08/15/2008  . HYPERTENSION 09/29/2006  . BACK PAIN, CHRONIC 05/20/2006  . Back pain, chronic 05/20/2006  . OBESITY, NOS 04/15/2006  . ANXIETY 04/15/2006  . TOBACCO DEPENDENCE 04/15/2006  . MIGRAINE, UNSPEC., W/O INTRACTABLE MIGRAINE 04/15/2006  . RHINITIS, ALLERGIC  04/15/2006  . ASTHMA, INTERMITTENT 04/15/2006  . APNEA, SLEEP 04/15/2006  . Anxiety state 04/15/2006  . 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 ABDOMINAL HYSTERECTOMY       OB History   None      Home Medications    Prior to Admission medications   Medication Sig Start Date End Date Taking? Authorizing Provider  albuterol (PROVENTIL HFA;VENTOLIN HFA) 108 (90 BASE) MCG/ACT inhaler Inhale 2 puffs into the lungs every 6 (six) hours as needed for wheezing or shortness of breath.   Yes [provider]  amLODIPine Besylate (NORVASC PO) Take 1 tablet by mouth daily.   Yes [provider]  atorvastatin (LIPITOR) 20 MG tablet Take 20 mg by mouth daily. 11/14/14 05/15/17 Yes [provider]  chlorthalidone (HYGROTON) 25 MG tablet Take 25 mg by mouth daily.   Yes [provider]  cloNIDine (CATAPRES) 0.1 MG tablet Take 0.1 mg by mouth daily. 05/01/15  Yes [provider]  EPINEPHrine 0.3 mg/0.3 mL IJ SOAJ injection Inject 0.3 mLs (0.3 mg total) into the muscle once. 07/24/15  Yes Gean Quint, MD  ibuprofen (ADVIL,MOTRIN) 600 MG tablet Take 600 mg by mouth as needed. 05/14/17  Yes [provider]  albuterol (VENTOLIN HFA) 108 (90 Base) MCG/ACT inhaler Inhale 2 puffs into the lungs every 6 (six) hours as needed for wheezing or shortness of breath. Patient not taking: Reported on 05/12/2017 07/24/15   Gean Quint, MD  butalbital-acetaminophen-caffeine (FIORICET, ESGIC) [phone] MG tablet Take 1-2 tablets by  mouth every 6 (six) hours as needed for headache. 05/15/17 05/15/18  Duffy Bruce, MD  diphenhydrAMINE (BENADRYL) 25 MG tablet Take 1 tablet (25 mg total) by mouth every 6 (six) hours. X 3 days, then as needed Patient not taking: Reported on 07/24/2015 06/26/15   Clayton Bibles, PA-C  famotidine (PEPCID) 20 MG tablet Take 1 tablet (20 mg total) by mouth 2 (two) times daily. Patient  not taking: Reported on 07/24/2015 06/26/15   Clayton Bibles, PA-C  fluticasone [facility]) 50 MCG/ACT nasal spray Use 1-2 sprays daily as need for stuffy nose or drainage. Patient not taking: Reported on 05/12/2017 07/24/15   Gean Quint, MD  hydrOXYzine (ATARAX/VISTARIL) 25 MG tablet Take 1 tablet (25 mg total) by mouth every 6 (six) hours. Patient not taking: Reported on 05/12/2017 06/29/15   Pisciotta, Elmyra Ricks, PA-C  mometasone-formoterol (DULERA) 200-5 MCG/ACT AERO Inhale 2 puffs into the lungs 2 (two) times daily. Patient not taking: Reported on 05/12/2017 07/24/15   Gean Quint, MD  permethrin (ELIMITE) 5 % cream Apply to entire body other than face - let sit for 14 hours then wash off, may repeat in 1 week if still having symptoms Patient not taking: Reported on 05/12/2017 06/29/15   Pisciotta, Elmyra Ricks, PA-C  predniSONE (STERAPRED UNI-PAK 21 TAB) 10 MG (21) TBPK tablet Take 1 tablet (10 mg total) by mouth daily. Day 1: take 6 tabs.  Day 2: 5 tabs  Day 3: 4 tabs  Day 4: 3 tabs  Day 5: 2 tabs  Day 6: 1 tab Patient not taking: Reported on 07/24/2015 06/26/15   Clayton Bibles, PA-C    Family History Family History  Problem Relation Age of Onset  . Hypertension Mother   . Diabetes Mother   . Stroke Mother   . Heart failure Mother   . Allergic rhinitis Neg Hx   . Angioedema Neg Hx   . Asthma Neg Hx   . Eczema Neg Hx   . Immunodeficiency Neg Hx   . Urticaria Neg Hx     Social History Social History   Tobacco Use  . Smoking status: Current Every Day Smoker    Packs/day: 1.00    Types: Cigarettes  . Smokeless tobacco: Never Used  Substance Use Topics  . Alcohol use: Yes    Comment: socially  . Drug use: No     Allergies   Ace inhibitors; Lincomycin hcl; Metronidazole; Clindamycin; Clindamycin/lincomycin; Duloxetine; Flexeril [cyclobenzaprine]; and Metoprolol succinate   Review of Systems Review of Systems  Constitutional: Positive for fatigue. Negative for chills and fever.  HENT:  Negative for congestion, rhinorrhea and sore throat.   Eyes: Negative for visual disturbance.  Respiratory: Negative for cough, shortness of breath and wheezing.   Cardiovascular: Negative for chest pain and leg swelling.  Gastrointestinal: Negative for abdominal pain, diarrhea, nausea and vomiting.  Genitourinary: Negative for dysuria, flank pain, vaginal bleeding and vaginal discharge.  Musculoskeletal: Negative for neck pain.  Skin: Negative for rash.  Allergic/Immunologic: Negative for immunocompromised state.  Neurological: Positive for headaches. Negative for syncope.  Hematological: Does not bruise/bleed easily.  All other systems reviewed and are negative.    Physical Exam Updated Vital Signs BP 137/89 (BP Location: Right Arm)   Pulse 97   Temp 98.3 F (36.8 C) (Oral)   Resp 20   SpO2 99%   Physical Exam  Constitutional: She is oriented to person, place, and time. She appears well-developed and well-nourished. No distress.  HENT:  Head: Normocephalic and atraumatic.  No facial edema or asymmetry.  Oropharynx clear.  No apparent dental trauma or infection.  No tenderness over the TMJ bilaterally.  Eyes: Conjunctivae are normal.  Pupils equal, round, and reactive to light.  No proptosis.  No conjunctival injection.  Neck: Normal range of motion. Neck supple.  Cardiovascular: Normal rate, regular rhythm and normal heart sounds. Exam reveals no friction rub.  No murmur heard. Pulmonary/Chest: Effort normal and breath sounds normal. No respiratory distress. She has no wheezes. She has no rales.  Abdominal: She exhibits no distension.  Musculoskeletal: She exhibits no edema.  Neurological: She is alert and oriented to person, place, and time. She exhibits normal muscle tone.  Skin: Skin is warm. Capillary refill takes less than 2 seconds.  Psychiatric: She has a normal mood and affect.  Nursing note and vitals reviewed.   Neurological Exam:  Mental Status: Alert and  oriented to person, place, and time. Attention and concentration normal. Speech clear. Recent memory is intact. Cranial Nerves: Visual fields grossly intact. EOMI and PERRLA. No nystagmus noted. Facial sensation intact at forehead, maxillary cheek, and chin/mandible bilaterally. No facial asymmetry or weakness. Hearing grossly normal. Uvula is midline, and palate elevates symmetrically. Normal SCM and trapezius strength. Tongue midline without fasciculations. Motor: Muscle strength 5/5 in proximal and distal UE and LE bilaterally. No pronator drift. Muscle tone normal. Reflexes: 2+ and symmetrical in all four extremities.  Sensation: Intact to light touch in upper and lower extremities distally bilaterally.  Gait: Normal without ataxia. Coordination: Normal FTN bilaterally.      ED Treatments / Results  Labs (all labs ordered are listed, but only abnormal results are displayed) Labs Reviewed  I-STAT CHEM 8, ED - Abnormal; Notable for the following components:      Result Value   Potassium 3.3 (*)    Chloride 96 (*)    Glucose, Bld 121 (*)    Calcium, Ion 1.11 (*)    TCO2 33 (*)    Hemoglobin 16.7 (*)    HCT 49.0 (*)    All other components within normal limits  I-STAT BETA HCG BLOOD, ED ([facility], [facility], [facility] ONLY)    EKG None  Radiology Ct Head Wo Contrast  Result Date: 05/15/2017 CLINICAL DATA:  47 year old female with acute LEFT headache for 2 days. History of epilepsy, migraines. EXAM: CT HEAD WITHOUT CONTRAST TECHNIQUE: Contiguous axial images were obtained from the base of the skull through the vertex without intravenous contrast. COMPARISON:  None. FINDINGS: Brain: Posterior LEFT parietal hypodensity has the appearance of remote infarct or encephalomalacia. There is no evidence of mass effect, hemorrhage, acute infarct, midline shift, extra-axial collection or hydrocephalus. Vascular: No hyperdense vessel or unexpected calcification. Skull: Normal. Negative for fracture or focal  lesion. Sinuses/Orbits: No acute finding. Other: None. IMPRESSION: 1. No evidence of acute intracranial abnormality. 2. 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 Electronically Signed   By: Margarette Canada M.D.   On: 05/15/2017 16:10    Procedures Procedures (including critical care time)  Medications Ordered in ED Medications  dexamethasone (DECADRON) injection 10 mg (has no administration in time range)  potassium chloride SA (K-DUR,KLOR-CON) CR tablet 40 mEq (has no administration in time range)  sodium chloride 0.9 % bolus 1,000 mL (1,000 mLs Intravenous New Bag/Given 05/15/17 1650)  ketorolac (TORADOL) 15 MG/ML injection 15 mg (15 mg Intravenous Given 05/15/17 1650)  prochlorperazine (COMPAZINE) injection 10 mg (10 mg Intravenous Given 05/15/17 1651)  diphenhydrAMINE (BENADRYL) injection  25 mg (25 mg Intravenous Given 05/15/17 1651)  valproate (DEPACON) 500 mg in dextrose 5 % 50 mL IVPB (500 mg Intravenous New Bag/Given 05/15/17 1700)     Initial Impression / Assessment and Plan / ED Course  I have reviewed the triage vital signs and the nursing notes.  Pertinent labs & imaging results that were available during my care of the patient were reviewed by me and considered in my medical decision making (see chart for details).  Clinical Course as of May 15 1829  Sat May 15, 2017  1621 Old infarct noted, likely etiology for her h/o seizures and does NOT correlate to her sx, doubt acute CVA but discussed this with her - she needs f/u with a Neurologist  CT Head Wo Contrast [CI]    Clinical Course User Index [CI] Duffy Bruce, MD    47 year old female here with ongoing left temporal headache.  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  The patient has well-documented history of chronic headaches and migraines.  There may be a component of hypertension as well given that it does seem to worsen when she is notably  hypertensive and she has been taking over-the-counter Sudafed which could be exacerbating this.  She was just seen and had an ESR which was negative, making temporal arteritis unlikely.  She has no focal neurological deficits assessment cranial stroke, but given persistence of headache will check CT.  Pain is not consistent with cavernous sinus thrombosis and she has a completely normal neurological exam and no evidence of cranial nerve dysfunction, facial swelling, or other symptoms.  Will treat her with modified migraine cocktail including Compazine as well as Depacon, as some of her symptoms are consistent with possible trigeminal neuralgia.  Will reassess response and follow. No fever, neck stiffness, or s/s meningitis or encephalitis.  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  Patient has a known history of this with subsequent seizure disorder, now resolved.  Otherwise, lab work is reassuring.  Potassium mildly low has been replaced.  I encouraged her to hydrate, and will give her a brief course of Fioricet.  I advised her to follow-up with a neurologist for her CT findings as well as chronic headaches.  Final Clinical Impressions(s) / ED Diagnoses   Final diagnoses:  Bad headache    ED Discharge Orders        Ordered    butalbital-acetaminophen-caffeine (FIORICET, ESGIC) 50-325-40 MG tablet  Every 6 hours PRN     05/15/17 1830       Duffy Bruce, MD 05/15/17 1831

## 2017-05-15 NOTE — ED Notes (Addendum)
Pt reports to this RN blurred vision in L eye. Pt reports blurred vision has been intermittent with migraines recently.  Neuro check unremarkable at this time, no facial droop noted.

## 2017-05-15 NOTE — Discharge Instructions (Signed)
As we discussed, I think some of her symptoms are due to inflammation of the nerve over your left temple.  I am going to prescribe you new medication to help with this.  It is okay to continue the ibuprofen, but be careful as this can cause ulcers.  You can also become dependent on ibuprofen which causes a persistent headache, so he tried to take this sparingly.  It is very important that he continue blood pressure medications.  For your sinus congestion and nasal pressure, do not take anything with Sudafed or pseudoephedrine in it.  You can take Coricidin HBP or other medications that are market it is safe for high blood pressure.

## 2017-05-15 NOTE — ED Triage Notes (Signed)
Patient complains of ongoing intermittent headache. Seen at [facility] ED for same and received meds with no relief. Here for further evaluation. Alert and oriented

## 2019-02-06 ENCOUNTER — Other Ambulatory Visit: Payer: Self-pay

## 2019-02-06 DIAGNOSIS — Z20822 Contact with and (suspected) exposure to covid-19: Secondary | ICD-10-CM

## 2019-02-07 LAB — NOVEL CORONAVIRUS, NAA: SARS-CoV-2, NAA: NOT DETECTED

## 2019-05-10 ENCOUNTER — Ambulatory Visit: Payer: Self-pay | Attending: Internal Medicine

## 2019-05-10 ENCOUNTER — Ambulatory Visit: Payer: Self-pay

## 2019-05-10 DIAGNOSIS — Z20822 Contact with and (suspected) exposure to covid-19: Secondary | ICD-10-CM

## 2019-05-11 LAB — SARS-COV-2, NAA 2 DAY TAT

## 2019-05-11 LAB — NOVEL CORONAVIRUS, NAA: SARS-CoV-2, NAA: NOT DETECTED

## 2019-08-02 ENCOUNTER — Encounter (HOSPITAL_BASED_OUTPATIENT_CLINIC_OR_DEPARTMENT_OTHER): Payer: Self-pay | Admitting: *Deleted

## 2019-08-02 ENCOUNTER — Other Ambulatory Visit: Payer: Self-pay

## 2019-08-02 ENCOUNTER — Emergency Department (HOSPITAL_BASED_OUTPATIENT_CLINIC_OR_DEPARTMENT_OTHER): Payer: BC Managed Care – PPO

## 2019-08-02 ENCOUNTER — Emergency Department (HOSPITAL_BASED_OUTPATIENT_CLINIC_OR_DEPARTMENT_OTHER)
Admission: EM | Admit: 2019-08-02 | Discharge: 2019-08-02 | Disposition: A | Discharge status: Home / Self Care | Payer: BC Managed Care – PPO | Attending: Emergency Medicine | Admitting: Emergency Medicine

## 2019-08-02 DIAGNOSIS — F17210 Nicotine dependence, cigarettes, uncomplicated: Secondary | ICD-10-CM | POA: Diagnosis not present

## 2019-08-02 DIAGNOSIS — M79605 Pain in left leg: Secondary | ICD-10-CM

## 2019-08-02 DIAGNOSIS — Z79899 Other long term (current) drug therapy: Secondary | ICD-10-CM | POA: Insufficient documentation

## 2019-08-02 DIAGNOSIS — J45909 Unspecified asthma, uncomplicated: Secondary | ICD-10-CM | POA: Insufficient documentation

## 2019-08-02 DIAGNOSIS — G8929 Other chronic pain: Secondary | ICD-10-CM | POA: Insufficient documentation

## 2019-08-02 DIAGNOSIS — M549 Dorsalgia, unspecified: Secondary | ICD-10-CM | POA: Insufficient documentation

## 2019-08-02 DIAGNOSIS — M791 Myalgia, unspecified site: Secondary | ICD-10-CM | POA: Insufficient documentation

## 2019-08-02 DIAGNOSIS — I10 Essential (primary) hypertension: Secondary | ICD-10-CM | POA: Diagnosis not present

## 2019-08-02 MED ORDER — METHOCARBAMOL 500 MG PO TABS: 500.0000 mg | ORAL_TABLET | Freq: Two times a day (BID) | ORAL | 0 refills | Status: DC

## 2019-08-02 NOTE — Discharge Instructions (Signed)
You do not have signs of a blood clot on your ultrasound today, this may be due to muscle spasm or sciatic inflammation continue with your home medications you can also try prescribed muscle relaxers but do not take these before driving as they can cause drowsiness.  Follow-up with your PCP and the orthopedist you have seen previously.

## 2019-08-02 NOTE — ED Provider Notes (Signed)
[facility] EMERGENCY DEPARTMENT Provider Note   CSN: [id number] Arrival date & time: 08/02/19  1340     History Chief Complaint  Patient presents with  . Leg Pain    Alicia Mosley is a 49 y.o. female.  Alicia Mosley is a 49 y.o. female with a history of asthma, hypertension, migraines, sleep apnea,.  Arthritis and chronic back pain, who presents to the ED from urgent care for evaluation of left leg pain.  She states that she always has pain in her back and legs that are chronic..  But noticed yesterday after she had been sitting for 2 hours she stood up and had some shooting pains in her right lower leg primarily noted in the calf.  She states today pain has been somewhat improved but she continues to have these intermittent shooting pains.  Denies associated numbness or weakness but did have some tingles in the lower leg yesterday.  Has not noticed any overlying skin changes.  Reports her typical chronic back and leg pain is unchanged.  No associated chest pain or shortness of breath.  Was sent from urgent care for DVT study.  Usually takes tramadol and uses Voltaren gel for her chronic arthritis.        Past Medical History:  Diagnosis Date  . Asthma   . Hypertension   . Migraine   . Sleep apnea     Patient Active Problem List   Diagnosis Date Noted  . Headache, tension-type 08/22/2014  . Obstructive apnea 08/22/2014  . Abnormal finding on mammography 12/06/2013  . Benign essential HTN 05/11/2013  . CN (constipation) 05/11/2013  . Clinical depression 05/11/2013  . Epilepsy (HCC) 05/11/2013  . Migraine without aura and responsive to treatment 05/11/2013  . GERD 11/26/2008  . Gastro-esophageal reflux disease without esophagitis 11/26/2008  . DEGENERATIVE JOINT DISEASE, RIGHT KNEE 08/15/2008  . Generalized OA 08/15/2008  . HYPERTENSION 09/29/2006  . BACK PAIN, CHRONIC 05/20/2006  . Back pain, chronic 05/20/2006  . OBESITY, NOS 04/15/2006  . ANXIETY  04/15/2006  . TOBACCO DEPENDENCE 04/15/2006  . MIGRAINE, UNSPEC., W/O INTRACTABLE MIGRAINE 04/15/2006  . RHINITIS, ALLERGIC 04/15/2006  . ASTHMA, INTERMITTENT 04/15/2006  . APNEA, SLEEP 04/15/2006  . Anxiety state 04/15/2006  . 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 ABDOMINAL HYSTERECTOMY       OB History   No obstetric history on file.     Family History  Problem Relation Age of Onset  . Hypertension Mother   . Diabetes Mother   . Stroke Mother   . Heart failure Mother   . Allergic rhinitis Neg Hx   . Angioedema Neg Hx   . Asthma Neg Hx   . Eczema Neg Hx   . Immunodeficiency Neg Hx   . Urticaria Neg Hx     Social History   Tobacco Use  . Smoking status: Current Every Day Smoker    Packs/day: 1.00    Types: Cigarettes  . Smokeless tobacco: Never Used  Substance Use Topics  . Alcohol use: Yes    Comment: socially  . Drug use: No    Home Medications Prior to Admission medications   Medication Sig Start Date End Date Taking? Authorizing Provider  albuterol (PROVENTIL HFA;VENTOLIN HFA) 108 (90 BASE) MCG/ACT inhaler Inhale 2 puffs into the lungs every 6 (six) hours as needed for wheezing or shortness of breath.    [provider]  albuterol (VENTOLIN  HFA) 108 (90 Base) MCG/ACT inhaler Inhale 2 puffs into the lungs every 6 (six) hours as needed for wheezing or shortness of breath. Patient not taking: Reported on 05/12/2017 07/24/15   Baxter Hire, MD  amLODIPine Besylate (NORVASC PO) Take 1 tablet by mouth daily.    [provider]  atorvastatin (LIPITOR) 20 MG tablet Take 20 mg by mouth daily. 11/14/14 05/15/17  [provider]  chlorthalidone (HYGROTON) 25 MG tablet Take 25 mg by mouth daily.    [provider]  cloNIDine (CATAPRES) 0.1 MG tablet Take 0.1 mg by mouth daily. 05/01/15   [provider]  diphenhydrAMINE (BENADRYL) 25 MG tablet Take 1 tablet (25 mg  total) by mouth every 6 (six) hours. X 3 days, then as needed Patient not taking: Reported on 07/24/2015 06/26/15   Trixie Dredge, PA-C  EPINEPHrine 0.3 mg/0.3 mL IJ SOAJ injection Inject 0.3 mLs (0.3 mg total) into the muscle once. 07/24/15   Baxter Hire, MD  famotidine (PEPCID) 20 MG tablet Take 1 tablet (20 mg total) by mouth 2 (two) times daily. Patient not taking: Reported on 07/24/2015 06/26/15   Trixie Dredge, PA-C  fluticasone [facility]) 50 MCG/ACT nasal spray Use 1-2 sprays daily as need for stuffy nose or drainage. Patient not taking: Reported on 05/12/2017 07/24/15   Baxter Hire, MD  hydrOXYzine (ATARAX/VISTARIL) 25 MG tablet Take 1 tablet (25 mg total) by mouth every 6 (six) hours. Patient not taking: Reported on 05/12/2017 06/29/15   Pisciotta, Joni Reining, PA-C  ibuprofen (ADVIL,MOTRIN) 600 MG tablet Take 600 mg by mouth as needed. 05/14/17   [provider]  mometasone-formoterol (DULERA) 200-5 MCG/ACT AERO Inhale 2 puffs into the lungs 2 (two) times daily. Patient not taking: Reported on 05/12/2017 07/24/15   Baxter Hire, MD    Allergies    Ace inhibitors, Lincomycin hcl, Metronidazole, Clindamycin, Clindamycin/lincomycin, Duloxetine, Flexeril [cyclobenzaprine], and Metoprolol succinate  Review of Systems   Review of Systems  Constitutional: Negative for chills and fever.  Respiratory: Negative for shortness of breath.   Cardiovascular: Negative for chest pain.  Musculoskeletal: Positive for arthralgias and myalgias. Negative for joint swelling.  Skin: Negative for color change and rash.  Neurological: Negative for weakness and numbness.    Physical Exam Updated Vital Signs BP (!) 174/116 (BP Location: Left Arm)   Pulse 91   Temp 98.2 F (36.8 C) (Oral)   Resp 16   Ht 5\' 3"  (1.6 m)   Wt 122.5 kg   SpO2 97%   BMI 47.83 kg/m   Physical Exam Vitals and nursing note reviewed.  Constitutional:      General: She is not in acute distress.    Appearance: Normal  appearance. She is well-developed and normal weight. She is not ill-appearing or diaphoretic.  HENT:     Head: Normocephalic and atraumatic.  Eyes:     General:        Right eye: No discharge.        Left eye: No discharge.  Pulmonary:     Effort: Pulmonary effort is normal. No respiratory distress.  Musculoskeletal:        General: Tenderness present. No swelling or deformity.     Comments: Left leg with some tenderness in the calf, no erythema or warmth, pain seems to be worsened with range of motion or straightening of the leg, there is no focal pain or swelling over the ankle knee or hip.  2+ DP and TP pulses, normal strength  and sensation.  Patient also has some lumbar spine tenderness.  Skin:    General: Skin is warm and dry.  Neurological:     Mental Status: She is alert and oriented to person, place, and time.     Coordination: Coordination normal.  Psychiatric:        Mood and Affect: Mood normal.        Behavior: Behavior normal.     ED Results / Procedures / Treatments   Labs (all labs ordered are listed, but only abnormal results are displayed) Labs Reviewed - No data to display  EKG None  Radiology US Venous Img Lower Unilateral Left  Result Date: 08/02/2019 CLINICAL DATA:  Left calf pain for 1 day EXAM: LEFT LOWER EXTREMITY VENOUS DOPPLER ULTRASOUND TECHNIQUE: Gray-scale sonography with graded compression, as well as color Doppler and duplex ultrasound were performed to evaluate the lower extremity deep venous systems from the level of the common femoral vein and including the common femoral, femoral, profunda femoral, popliteal and calf veins including the posterior tibial, peroneal and gastrocnemius veins when visible. The superficial great saphenous vein was also interrogated. Spectral Doppler was utilized to evaluate flow at rest and with distal augmentation maneuvers in the common femoral, femoral and popliteal veins. COMPARISON:  None. FINDINGS: Contralateral  Common Femoral Vein: Respiratory phasicity is normal and symmetric with the symptomatic side. No evidence of thrombus. Normal compressibility. Common Femoral Vein: No evidence of thrombus. Normal compressibility, respiratory phasicity and response to augmentation. Saphenofemoral Junction: No evidence of thrombus. Normal compressibility and flow on color Doppler imaging. Profunda Femoral Vein: No evidence of thrombus. Normal compressibility and flow on color Doppler imaging. Femoral Vein: No evidence of thrombus. Normal compressibility, respiratory phasicity and response to augmentation. Popliteal Vein: No evidence of thrombus. Normal compressibility, respiratory phasicity and response to augmentation. Calf Veins: Limited assessment of the calf veins because of body habitus. No gross thrombus visualized. IMPRESSION: No significant left lower extremity DVT. Limited assessment of the calf veins. Electronically Signed   By: Jerilynn Mages.  Shick M.D.   On: 08/02/2019 14:13    Procedures Procedures (including critical care time)  Medications Ordered in ED Medications - No data to display  ED Course  I have reviewed the triage vital signs and the nursing notes.  Pertinent labs & imaging results that were available during my care of the patient were reviewed by me and considered in my medical decision making (see chart for details).    MDM Rules/Calculators/A&P                          49 year old female presents from urgent care for evaluation of left leg pain, has a history of chronic back pain and arthritis in bilateral lower extremities but yesterday after sitting for 2 hours had an episode of shooting pains in the lower leg, especially in the calf with some tingling.  Sent for DVT study which was completed and negative today.  The leg is neurovascularly intact with no overlying skin changes or focal arthralgias.  Suspect pain may have been related to sciatic inflammation after prolonged sitting or related to  patient's chronic arthritis and back pain.  Patient is already prescribed tramadol and uses Voltaren gel, patient feels like she is having spasms in the back of her leg and back that may be contributing to pain, will try a course of muscle relaxers I encouraged her to follow-up with PCP and orthopedist that she seen previously.  She expresses understanding and agreement with plan.  Discharged home in good condition.  Final Clinical Impression(s) / ED Diagnoses Final diagnoses:  Left leg pain    Rx / DC Orders ED Discharge Orders         Ordered    methocarbamol (ROBAXIN) 500 MG tablet  2 times daily     Discontinue  Reprint     08/02/19 1538           Dartha Lodge, [place] 08/02/19 1538    Arby Barrette, MD 08/03/19 1220

## 2019-08-02 NOTE — ED Triage Notes (Signed)
sent here form [facility] for [place] left leg , c/o left leg pain and " tingling" x 2 days

## 2019-10-03 ENCOUNTER — Ambulatory Visit: Payer: BC Managed Care – PPO | Attending: Internal Medicine

## 2019-10-03 DIAGNOSIS — Z23 Encounter for immunization: Secondary | ICD-10-CM

## 2019-10-03 NOTE — Progress Notes (Signed)
   Covid-19 Vaccination Clinic  Name:  Alicia Mosley    MRN: [id number] DOB: May 30, 1970  10/03/2019  [name] was observed post Covid-19 immunization for 15 minutes without incident. She was provided with Vaccine Information Sheet and instruction to access the V-Safe system.   [name] was instructed to call 911 with any severe reactions post vaccine: Marland Kitchen Difficulty breathing  . Swelling of face and throat  . A fast heartbeat  . A bad rash all over body  . Dizziness and weakness   Immunizations Administered    Name Date Dose VIS Date Route   Moderna COVID-19 Vaccine 10/03/2019  3:03 PM 0.5 mL 01/2019 Intramuscular   Manufacturer: Moderna   Lot: [id number]   NDC: 89169-450-38

## 2019-10-31 ENCOUNTER — Ambulatory Visit: Payer: BC Managed Care – PPO | Attending: Internal Medicine

## 2019-10-31 DIAGNOSIS — Z23 Encounter for immunization: Secondary | ICD-10-CM

## 2019-10-31 NOTE — Progress Notes (Signed)
   Covid-19 Vaccination Clinic  Name:  Alicia Mosley    MRN: [id number] DOB: 07/03/1970  10/31/2019  [name] was observed post Covid-19 immunization for 15 minutes without incident. She was provided with Vaccine Information Sheet and instruction to access the V-Safe system.   [name] was instructed to call 911 with any severe reactions post vaccine: Marland Kitchen Difficulty breathing  . Swelling of face and throat  . A fast heartbeat  . A bad rash all over body  . Dizziness and weakness   Immunizations Administered    Name Date Dose VIS Date Route   Moderna COVID-19 Vaccine 10/31/2019  4:03 PM 0.5 mL 01/2019 Intramuscular   Manufacturer: Moderna   Lot: [id number]   NDC: 73428-768-11

## 2019-11-28 IMAGING — CT CT HEAD W/O CM
4 series · 16 of 47 positions shown, 18 images · non-contrast
Comparison: None.

CLINICAL DATA: 46-year-old female with acute LEFT headache for 2
days. History of epilepsy, migraines.

EXAM:
CT HEAD WITHOUT CONTRAST
TECHNIQUE: Contiguous axial images were obtained from the base of the skull
through the vertex without intravenous contrast.

[Series 3: head without · axial · non-contrast · 0.45mm/px · z∈[-231,-116]mm · 7 of 31 slices shown, 9 images]
[im 4/31  brain]
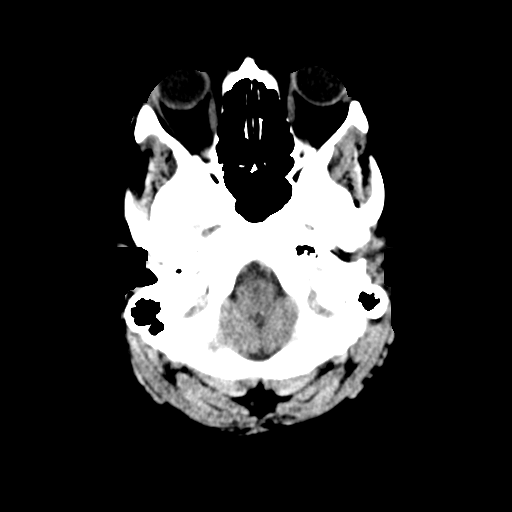
[im 4/31  bone]
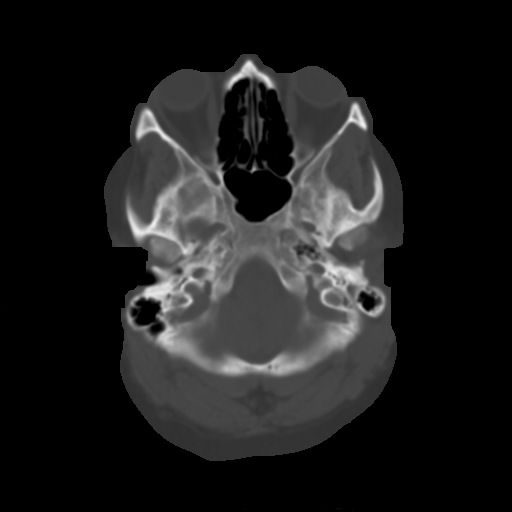
[im 8/31  brain]
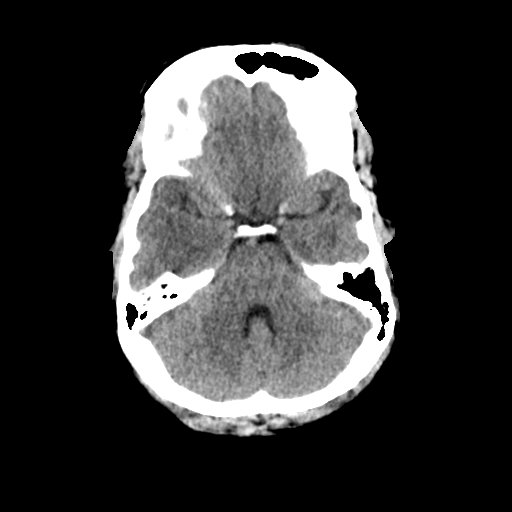
[im 12/31  brain]
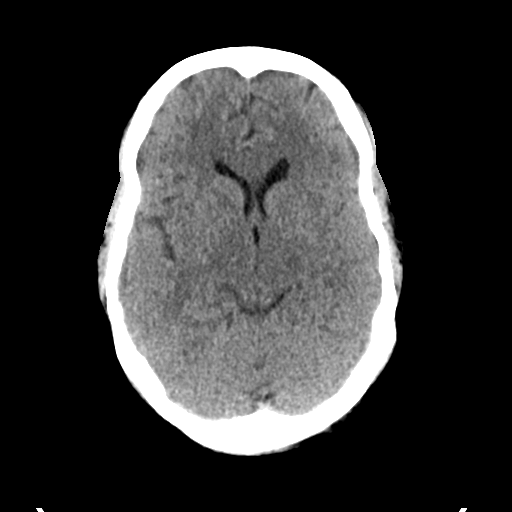
[im 16/31  brain]
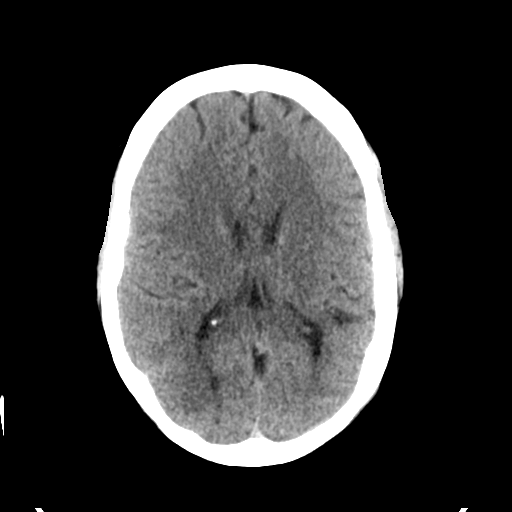
[im 19/31  brain]
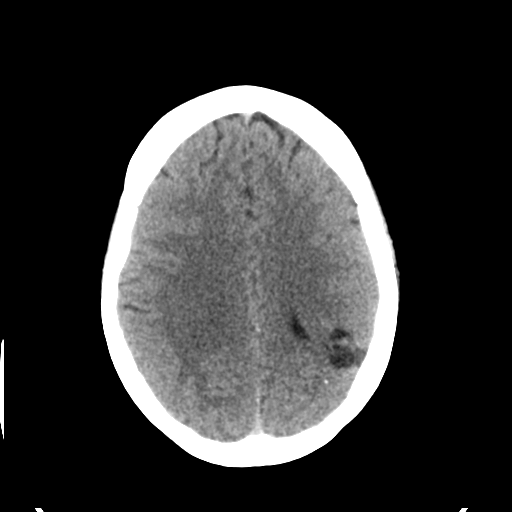
[im 19/31  bone]
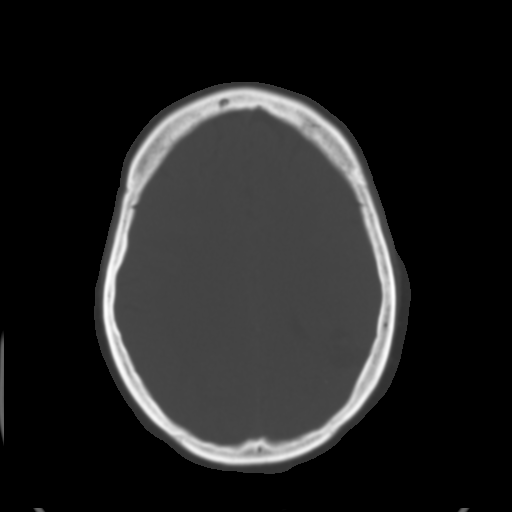
[im 23/31  brain]
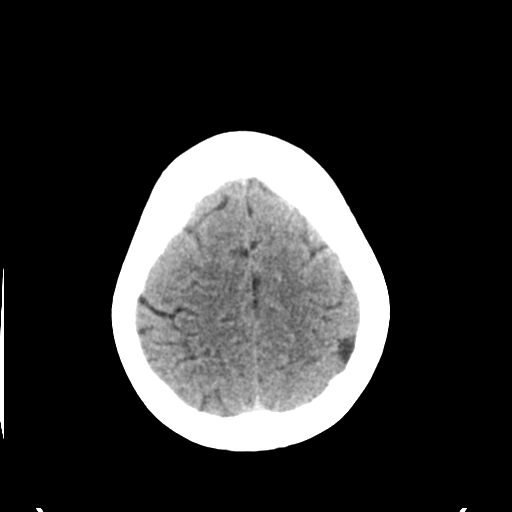
[im 27/31  brain]
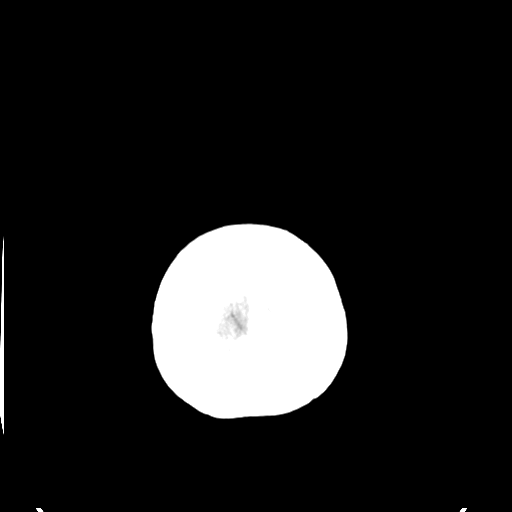

[Series 4: head bone · axial · 0.45mm/px · z∈[-232,-202]mm · 3 of 77 slices shown]
[im 8/77  bone]
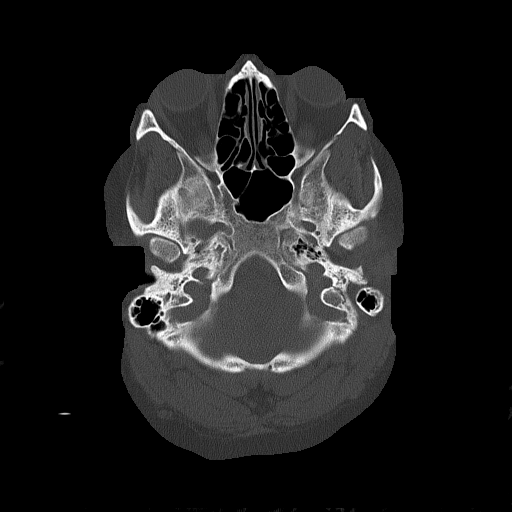
[im 16/77  bone]
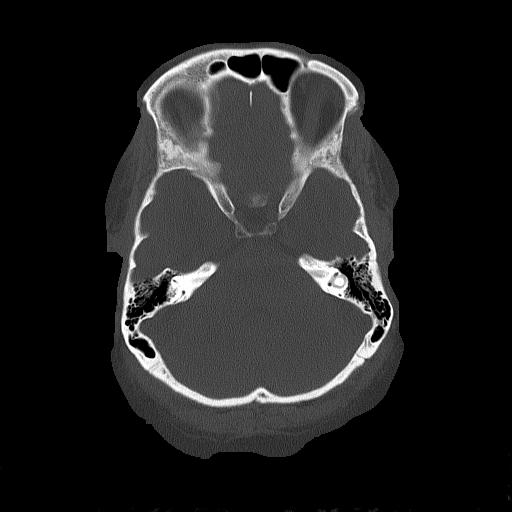
[im 23/77  bone]
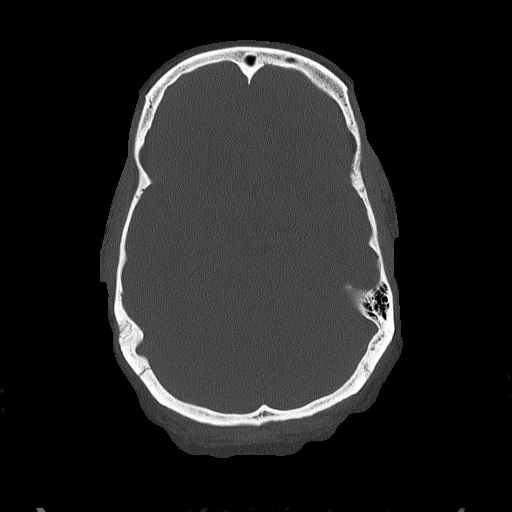

[Series 5: head without cor · coronal · non-contrast · 0.30mm/px · 3 of 65 slices shown]
[im 22/65  brain]
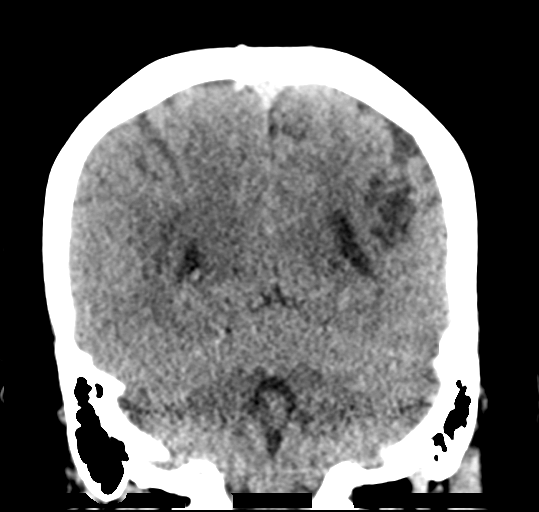
[im 29/65  brain]
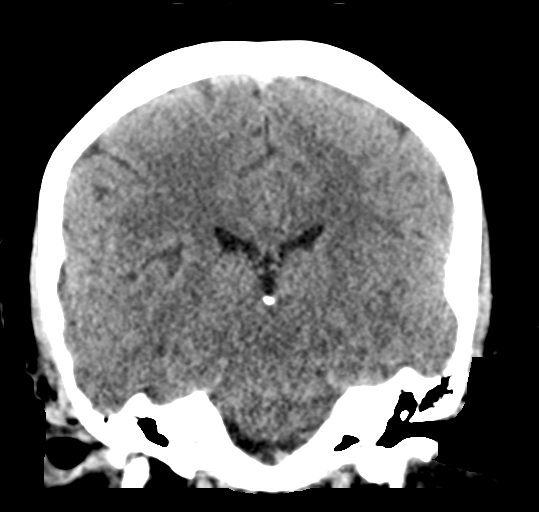
[im 36/65  brain]
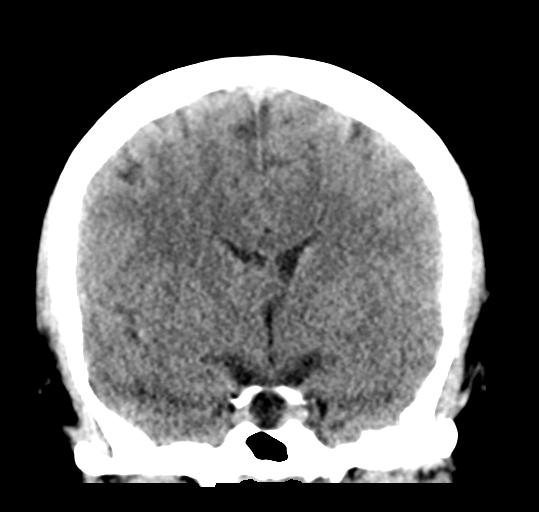

[Series 6: head without sag · sagittal · non-contrast · 0.30mm/px · 3 of 67 slices shown]
[im 23/67  brain]
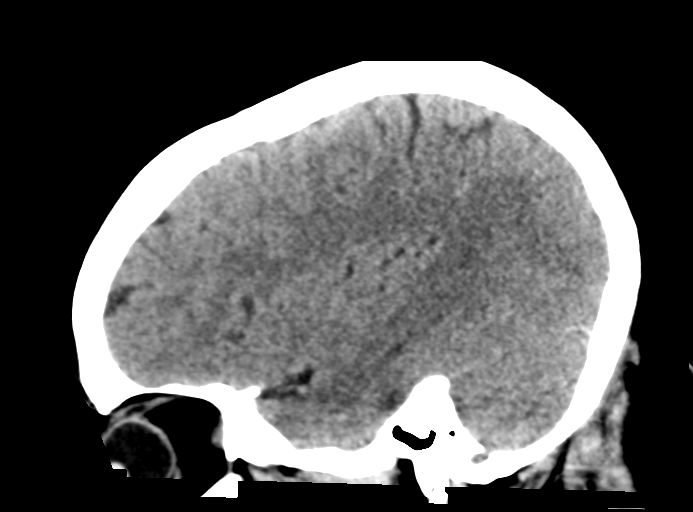
[im 34/67  brain]
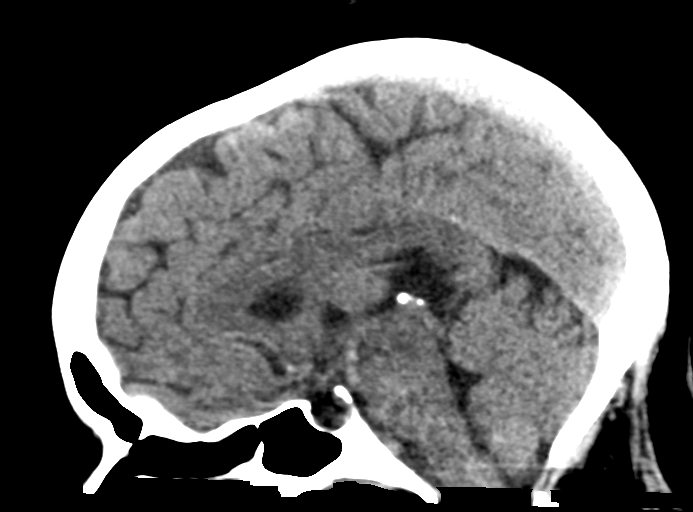
[im 45/67  brain]
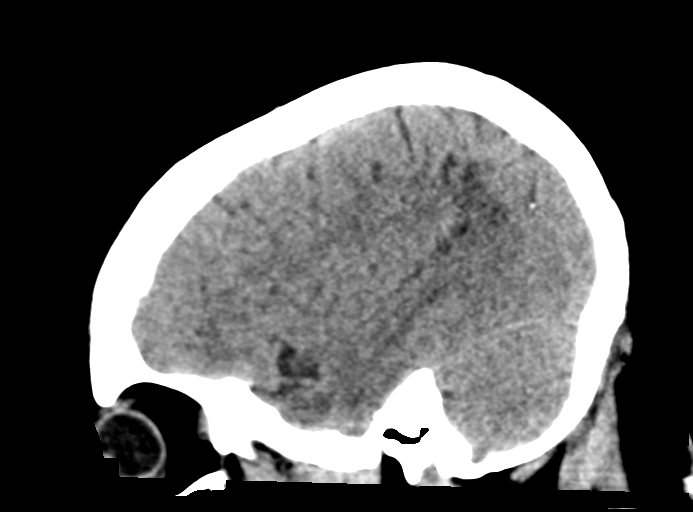

[16 of 47 positions shown; findings below may reference images not displayed]

FINDINGS: Brain: Posterior LEFT parietal hypodensity has the appearance of
remote infarct or encephalomalacia.

There is no evidence of mass effect, hemorrhage, acute infarct,
midline shift, extra-axial collection or hydrocephalus.

Vascular: No hyperdense vessel or unexpected calcification.

Skull: Normal. Negative for fracture or focal lesion.

Sinuses/Orbits: No acute finding.

Other: None.
IMPRESSION: 1. No evidence of acute intracranial abnormality.
2. LEFT parietal hypodensity which appears chronic and likely
representing remote infarct or encephalomalacia.

## 2021-12-21 ENCOUNTER — Encounter (HOSPITAL_COMMUNITY): Payer: Self-pay | Admitting: Emergency Medicine

## 2021-12-21 ENCOUNTER — Emergency Department (HOSPITAL_COMMUNITY)
Admission: EM | Admit: 2021-12-21 | Discharge: 2021-12-21 | Disposition: A | Discharge status: Home / Self Care | Payer: BC Managed Care – PPO | Attending: Emergency Medicine | Admitting: Emergency Medicine

## 2021-12-21 ENCOUNTER — Other Ambulatory Visit: Payer: Self-pay

## 2021-12-21 DIAGNOSIS — F17200 Nicotine dependence, unspecified, uncomplicated: Secondary | ICD-10-CM | POA: Insufficient documentation

## 2021-12-21 DIAGNOSIS — X501XXA Overexertion from prolonged static or awkward postures, initial encounter: Secondary | ICD-10-CM | POA: Insufficient documentation

## 2021-12-21 DIAGNOSIS — S86211A Strain of muscle(s) and tendon(s) of anterior muscle group at lower leg level, right leg, initial encounter: Secondary | ICD-10-CM | POA: Insufficient documentation

## 2021-12-21 DIAGNOSIS — S8991XA Unspecified injury of right lower leg, initial encounter: Secondary | ICD-10-CM | POA: Diagnosis present

## 2021-12-21 DIAGNOSIS — M7989 Other specified soft tissue disorders: Secondary | ICD-10-CM | POA: Insufficient documentation

## 2021-12-21 MED ORDER — METHOCARBAMOL 500 MG PO TABS
500.0000 mg | ORAL_TABLET | Freq: Two times a day (BID) | ORAL | 0 refills | Status: DC
Start: 2021-12-21 — End: 2023-02-05

## 2021-12-21 MED ORDER — IBUPROFEN 600 MG PO TABS: 600.0000 mg | ORAL_TABLET | ORAL | 0 refills | Status: DC | PRN

## 2021-12-21 NOTE — ED Triage Notes (Signed)
Pt c/o right lower leg pain since 1230 today. States that she thinks that she stepped wrong and is having shooting pain. Pt ambulatory to triage

## 2021-12-21 NOTE — ED Provider Notes (Signed)
[facility]-EMERGENCY DEPT Provider Note   CSN: [id number] Arrival date & time: 12/21/21  2016     History  Chief Complaint  Patient presents with   Leg Pain    Alicia Mosley is a 51 y.o. female.  The history is provided by the patient and medical records. No language interpreter was used.  Leg Pain    51 year old female presenting for evaluation of right leg pain.  Patient has significant history of degenerative joint disease, chronic back pain, tobacco use.  Patient mention this afternoon she went outside on her porch to smoke a cigarette when she accidentally dropped a cigarette.  She reached down to pick up a cigarette when she felt pain to her right lower leg.  Pain is sharp achy moderate intensity not improved despite taking her muscle relaxant.  She was able to ambulate afterward.  She denies falling to the ground.  No prior history of PE or DVT.  She denies any numbness weakness fever or rash.  Home Medications Prior to Admission medications   Medication Sig Start Date End Date Taking? Authorizing Provider  albuterol (PROVENTIL HFA;VENTOLIN HFA) 108 (90 BASE) MCG/ACT inhaler Inhale 2 puffs into the lungs every 6 (six) hours as needed for wheezing or shortness of breath.    [provider]  albuterol (VENTOLIN HFA) 108 (90 Base) MCG/ACT inhaler Inhale 2 puffs into the lungs every 6 (six) hours as needed for wheezing or shortness of breath. Patient not taking: Reported on 05/12/2017 07/24/15   Baxter Hire, MD  amLODIPine Besylate (NORVASC PO) Take 1 tablet by mouth daily.    [provider]  atorvastatin (LIPITOR) 20 MG tablet Take 20 mg by mouth daily. 11/14/14 05/15/17  [provider]  chlorthalidone (HYGROTON) 25 MG tablet Take 25 mg by mouth daily.    [provider]  cloNIDine (CATAPRES) 0.1 MG tablet Take 0.1 mg by mouth daily. 05/01/15   [provider]  diphenhydrAMINE (BENADRYL) 25 MG tablet Take 1  tablet (25 mg total) by mouth every 6 (six) hours. X 3 days, then as needed Patient not taking: Reported on 07/24/2015 06/26/15   Trixie Dredge, PA-C  EPINEPHrine 0.3 mg/0.3 mL IJ SOAJ injection Inject 0.3 mLs (0.3 mg total) into the muscle once. 07/24/15   Baxter Hire, MD  famotidine (PEPCID) 20 MG tablet Take 1 tablet (20 mg total) by mouth 2 (two) times daily. Patient not taking: Reported on 07/24/2015 06/26/15   Trixie Dredge, PA-C  fluticasone [facility]) 50 MCG/ACT nasal spray Use 1-2 sprays daily as need for stuffy nose or drainage. Patient not taking: Reported on 05/12/2017 07/24/15   Baxter Hire, MD  hydrOXYzine (ATARAX/VISTARIL) 25 MG tablet Take 1 tablet (25 mg total) by mouth every 6 (six) hours. Patient not taking: Reported on 05/12/2017 06/29/15   Pisciotta, Joni Reining, PA-C  ibuprofen (ADVIL,MOTRIN) 600 MG tablet Take 600 mg by mouth as needed. 05/14/17   [provider]  methocarbamol (ROBAXIN) 500 MG tablet Take 1 tablet (500 mg total) by mouth 2 (two) times daily. 08/02/19   Dartha Lodge, PA-C  mometasone-formoterol (DULERA) 200-5 MCG/ACT AERO Inhale 2 puffs into the lungs 2 (two) times daily. Patient not taking: Reported on 05/12/2017 07/24/15   Baxter Hire, MD      Allergies    Ace inhibitors, Lincomycin hcl, Metronidazole, Clindamycin, Clindamycin/lincomycin, Duloxetine, Flexeril [cyclobenzaprine], and Metoprolol succinate    Review of Systems   Review of Systems  All other systems  reviewed and are negative.   Physical Exam Updated Vital Signs BP (!) 174/119   Pulse 97   Temp 98 F (36.7 C) (Oral)   Resp 18   Ht 5\' 3"  (1.6 m)   Wt 122.5 kg   SpO2 97%   BMI 47.83 kg/m  Physical Exam Vitals and nursing note reviewed.  Constitutional:      General: She is not in acute distress.    Appearance: She is well-developed. She is obese.  HENT:     Head: Atraumatic.  Eyes:     Conjunctiva/sclera: Conjunctivae normal.  Pulmonary:     Effort: Pulmonary effort is  normal.  Musculoskeletal:        General: Tenderness (Right lower extremity: Tenderness to mid anterior tibial region and along the gastrocnemius without any obvious deformity no erythema edema or warmth.  Intact status pedis pulse.) present.     Cervical back: Neck supple.     Comments: Right knee and right ankle nontender.  Patient able to ambulate.  Skin:    Findings: No rash.  Neurological:     Mental Status: She is alert.  Psychiatric:        Mood and Affect: Mood normal.     ED Results / Procedures / Treatments   Labs (all labs ordered are listed, but only abnormal results are displayed) Labs Reviewed - No data to display  EKG None  Radiology No results found.  Procedures Procedures    Medications Ordered in ED Medications - No data to display  ED Course/ Medical Decision Making/ A&P                           Medical Decision Making  BP (!) 174/119   Pulse 97   Temp 98 F (36.7 C) (Oral)   Resp 18   Ht 5\' 3"  (1.6 m)   Wt 122.5 kg   SpO2 97%   BMI 47.83 kg/m   65:69 PM  51 year old female presenting for evaluation of right leg pain.  Patient has significant history of degenerative joint disease, chronic back pain, tobacco use.  Patient mention this afternoon she went outside on her porch to smoke a cigarette when she accidentally dropped a cigarette.  She reached down to pick up a cigarette when she felt pain to her right lower leg.  Pain is sharp achy moderate intensity not improved despite taking her muscle relaxant.  She was able to ambulate afterward.  She denies falling to the ground.  No prior history of PE or DVT.  She denies any numbness weakness fever or rash.  On exam this is an obese female.  She has tenderness to her right lower extremity at the mid anterior tib-fib as well as along the gastrocnemius muscle but no overlying skin changes no erythema edema or warmth.  No signs of infection.  No obvious deformity.  She is neurovascular intact.  She  ambulate without difficulty. I suspect this is likely a muscle strain.  I have low suspicion to be DVT.  I doubt she has any fracture or dislocation.  I have considered advanced imaging but felt it is low yield in the setting of no trauma.  I will provide RICE therapy including Ace wrap and will provide as supportive care.  Return precaution was given.  Patient voiced understanding and agrees with plan.         Final Clinical Impression(s) / ED Diagnoses Final diagnoses:  Strain  of muscle(s) and tendon(s) of anterior muscle group at lower leg level, right leg, initial encounter    Rx / DC Orders ED Discharge Orders          Ordered    methocarbamol (ROBAXIN) 500 MG tablet  2 times daily        12/21/21 2047    ibuprofen (ADVIL) 600 MG tablet  As needed        12/21/21 2047              Fayrene Helper, PA-C 12/21/21 2047    Linwood Dibbles, MD 12/21/21 [phone]

## 2023-02-05 ENCOUNTER — Telehealth (HOSPITAL_COMMUNITY): Payer: Self-pay

## 2023-02-05 ENCOUNTER — Encounter (HOSPITAL_BASED_OUTPATIENT_CLINIC_OR_DEPARTMENT_OTHER): Payer: Self-pay | Admitting: Pulmonary Disease

## 2023-02-05 ENCOUNTER — Other Ambulatory Visit (HOSPITAL_COMMUNITY): Payer: Self-pay

## 2023-02-05 ENCOUNTER — Ambulatory Visit (HOSPITAL_BASED_OUTPATIENT_CLINIC_OR_DEPARTMENT_OTHER): Payer: BC Managed Care – PPO | Admitting: Pulmonary Disease

## 2023-02-05 VITALS — BP 128/88 | HR 96 | Resp 16 | Ht 63.0 in | Wt 276.0 lb

## 2023-02-05 DIAGNOSIS — F17210 Nicotine dependence, cigarettes, uncomplicated: Secondary | ICD-10-CM

## 2023-02-05 DIAGNOSIS — J432 Centrilobular emphysema: Secondary | ICD-10-CM

## 2023-02-05 DIAGNOSIS — F17200 Nicotine dependence, unspecified, uncomplicated: Secondary | ICD-10-CM

## 2023-02-05 MED ORDER — BUDESONIDE-FORMOTEROL FUMARATE 160-4.5 MCG/ACT IN AERO: 2.0000 | INHALATION_SPRAY | Freq: Two times a day (BID) | RESPIRATORY_TRACT | 5 refills | Status: DC

## 2023-02-05 MED ORDER — SPIRIVA RESPIMAT 2.5 MCG/ACT IN AERS: 2.0000 | INHALATION_SPRAY | Freq: Every day | RESPIRATORY_TRACT | Status: AC

## 2023-02-05 NOTE — Patient Instructions (Signed)
Emphysema --ORDER pulmonary function tests --CONTINUE Breyna TWO puffs in the morning and evening. Prescription sent to pharmacy --START Spiriva 2.5 mcg TWO puffs in the morning. Samples given. Take until you run out --CONTINUE Albuterol AS NEEDED for shortness of breath or wheezing  Tobacco abuse Patient is an active smoker. We discussed smoking cessation for >5 minutes. We discussed triggers and stressors and ways to deal with them. We discussed barriers to continued smoking and benefits of smoking cessation. Provided patient with information cessation techniques and interventions including [place] quitline.

## 2023-02-05 NOTE — Progress Notes (Signed)
Subjective:   PATIENT ID: Alicia Mosley GENDER: female DOB: November 01, 1970, MRN: [id number]  Chief Complaint  Patient presents with   Consult    Reason for Visit: New consult for emphysema  Ms. Alicia Mosley is a 52 year old female active smoker with asthma, emphysema, OSA, HTN, HLD, GERD, epilepsy and migraine who presents for new consult for emphysema.  She recently had CT lung screen completed 08/2022 and was told she had emphysema. Her father had emphysema. She remains an active smoker. Unable to take wellbutrin due to prior seizures. Has tried patches and still needed cigarettes. 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 She reports history of dry mouth but now noticing chest congestion, productive cough daily in the mornings in the last year that has worsened some.   Social History: Active smoker 25-30 years. 1 ppd. Currently 1/2 ppd.  I have personally reviewed patient's past medical/family/social history, allergies, current medications.  Past Medical History:  Diagnosis Date   Asthma    Emphysema of lung (HCC)    Hypertension    Migraine    Sleep apnea      Family History  Problem Relation Age of Onset   Hypertension Mother    Diabetes Mother    Stroke Mother    Heart failure Mother    Allergic rhinitis Neg Hx    Angioedema Neg Hx    Asthma Neg Hx    Eczema Neg Hx    Immunodeficiency Neg Hx    Urticaria Neg Hx      Social History   Occupational History   Not on file  Tobacco Use   Smoking status: Every Day    Current packs/day: 1.00    Types: Cigarettes    Passive exposure: Current   Smokeless tobacco: Never  Vaping Use   Vaping status: Never Used  Substance and Sexual Activity   Alcohol use: Yes    Comment: socially   Drug use: No   Sexual activity: Not on file    Allergies  Allergen Reactions   Ace Inhibitors Swelling    REACTION: angioedema   Lincomycin Hcl Hives and Swelling   Metronidazole  Anaphylaxis    REACTION: anaphylaxis   Paroxetine Hcl Hives   Clindamycin     Throat swelling, hives   Clindamycin/Lincomycin Hives and Swelling   Duloxetine Itching and Swelling    angioedema   Flexeril [Cyclobenzaprine] Other (See Comments)    Facial swelling. No problems with breathing   Metoprolol Succinate Itching    REACTION: Itching     Outpatient Medications Prior to Visit  Medication Sig Dispense Refill   albuterol (PROVENTIL HFA;VENTOLIN HFA) 108 (90 BASE) MCG/ACT inhaler Inhale 2 puffs into the lungs every 6 (six) hours as needed for wheezing or shortness of breath.     amLODIPine Besylate (NORVASC PO) Take 1 tablet by mouth daily.     atorvastatin (LIPITOR) 20 MG tablet Take 20 mg by mouth daily.     chlorthalidone (HYGROTON) 25 MG tablet Take 25 mg by mouth daily.     cloNIDine (CATAPRES) 0.1 MG tablet Take 0.1 mg by mouth daily.     ibuprofen (ADVIL) 600 MG tablet Take 1 tablet (600 mg total) by mouth as needed. 30 tablet 0   EPINEPHrine 0.3 mg/0.3 mL IJ SOAJ injection Inject 0.3 mLs (0.3 mg total) into the muscle once. 2 Device 1   methocarbamol (ROBAXIN) 500 MG tablet Take 1 tablet (500 mg  total) by mouth 2 (two) times daily. 20 tablet 0   No facility-administered medications prior to visit.    Review of Systems  Constitutional:  Negative for chills, diaphoresis, fever, malaise/fatigue and weight loss.  HENT:  Negative for congestion.   Respiratory:  Positive for cough, sputum production and shortness of breath. Negative for hemoptysis and wheezing.   Cardiovascular:  Negative for chest pain, palpitations and leg swelling.     Objective:   Vitals:   02/05/23 0909  BP: 128/88  Pulse: 96  Resp: 16  SpO2: 97%  Weight: 276 lb (125.2 kg)  Height: 5\' 3"  (1.6 m)  Body mass index is 48.89 kg/m.  SpO2: 97 %  Physical Exam: General: Well-appearing, no acute distress HENT: [place], AT Eyes: EOMI, no scleral icterus Respiratory: Clear to auscultation bilaterally.   No crackles, wheezing or rales Cardiovascular: RRR, -M/R/G, no JVD Extremities:-Edema,-tenderness Neuro: AAO x4, CNII-XII grossly intact Psych: Normal mood, normal affect  Data Reviewed:  Imaging: CXR 02/05/15 - Normal CT Lung Screen 09/11/22 [facility] (report only) - "Centrilobular emphysema noted. Multiple bilateral  pulmonary nodules are identified measuring up to maximum volume derived equivalent diameter of 7.5 mm (posterior right lower lobe on image 118). Multiple additional nodules ranging in size from 6.2 mm  up to 7.2 mm are identified bilaterally. No focal airspace consolidation. No pleural effusion."  PFT: 07/24/15 Spirometry FVC 1.85 (72%) FEV1 1.59 (73%) Ratio 84  Post change FVC 14%. Post change FEV1 20% Interpretation: Normal spirometry. Significant bronchodilator response  Labs: CBC    Component Value Date/Time   WBC 10.0 06/29/2015 1952   RBC 4.91 06/29/2015 1952   HGB 16.7 (H) 05/15/2017 1641   HCT 49.0 (H) 05/15/2017 1641   PLT 362 06/29/2015 1952   MCV 90.0 06/29/2015 1952   MCH 32.2 06/29/2015 1952   MCHC 35.7 06/29/2015 1952   RDW 13.8 06/29/2015 1952   LYMPHSABS 2.8 06/29/2015 1952   MONOABS 0.8 06/29/2015 1952   EOSABS 0.0 06/29/2015 1952   BASOSABS 0.0 06/29/2015 1952   Absolute eos 06/29/15     Assessment & Plan:   Discussion: 52 year old female active smoker with asthma, emphysema, OSA, HTN, HLD, GERD, epilepsy and migraine who presents for new consult for emphysema. Discussed clinical course and management of COPD/asthma including bronchodilator regimen, preventive care including vaccinations and action plan for exacerbation.   Emphysema Asthma --ORDER pulmonary function tests --CONTINUE Breyna TWO puffs in the morning and evening. Prescription sent to pharmacy --START Spiriva 2.5 mcg TWO puffs in the morning. Samples given. Take until you run out --CONTINUE Albuterol AS NEEDED for shortness of breath or wheezing  Tobacco abuse Patient is an  active smoker. We discussed smoking cessation for >5 minutes. We discussed triggers and stressors and ways to deal with them. We discussed barriers to continued smoking and benefits of smoking cessation. Provided patient with information cessation techniques and interventions including [place] quitline.   Health Maintenance Immunization History  Administered Date(s) Administered   Influenza Whole 01/08/2009   Moderna Sars-Covid-2 Vaccination 10/03/2019, 10/31/2019   Td 02/17/1996, 09/21/2007   CT Lung Screen - enrolled  No orders of the defined types were placed in this encounter.  Meds ordered this encounter  Medications   budesonide-formoterol (BREYNA) 160-4.5 MCG/ACT inhaler    Sig: Inhale 2 puffs into the lungs 2 (two) times daily.    Dispense:  1 each    Refill:  5    Return for after PFT. VIdeo visit ok.  I have spent a total time of 45-minutes on the day of the appointment reviewing prior documentation, coordinating care and discussing medical diagnosis and plan with the patient/family. Imaging, labs and tests included in this note have been reviewed and interpreted independently by me.  Akaya Proffit Mechele Collin, MD [facility] 02/05/2023 9:46 AM

## 2023-02-05 NOTE — Telephone Encounter (Signed)
Pharmacy Patient Advocate Encounter  Insurance verification completed.    The patient is insured through [facility].     Ran test claim for Jerral Ralph and the current 30 day co-pay is $0.00.  Ran test claim for Spiriva and the current 30 day co-pay is $415.09, due to deductible.  This test claim was processed through [facility]- copay amounts may vary at other pharmacies due to pharmacy/plan contracts, or as the patient moves through the different stages of their insurance plan.

## 2023-02-05 NOTE — Addendum Note (Signed)
Addended by: Jama Flavors on: 02/05/2023 10:51 AM   Modules accepted: Orders

## 2023-02-08 ENCOUNTER — Other Ambulatory Visit (HOSPITAL_BASED_OUTPATIENT_CLINIC_OR_DEPARTMENT_OTHER): Payer: Self-pay

## 2023-02-08 DIAGNOSIS — J432 Centrilobular emphysema: Secondary | ICD-10-CM

## 2023-02-12 ENCOUNTER — Encounter (HOSPITAL_BASED_OUTPATIENT_CLINIC_OR_DEPARTMENT_OTHER): Payer: BC Managed Care – PPO

## 2023-02-19 ENCOUNTER — Ambulatory Visit (HOSPITAL_BASED_OUTPATIENT_CLINIC_OR_DEPARTMENT_OTHER): Payer: BC Managed Care – PPO | Admitting: Pulmonary Disease

## 2023-02-19 DIAGNOSIS — J432 Centrilobular emphysema: Secondary | ICD-10-CM | POA: Diagnosis not present

## 2023-02-19 LAB — PULMONARY FUNCTION TEST
DL/VA % pred: 127 %
DL/VA: 5.47 ml/min/mmHg/L
DLCO cor % pred: 87 %
DLCO cor: 18.34 ml/min/mmHg
DLCO unc % pred: 87 %
DLCO unc: 18.34 ml/min/mmHg
FEF 25-75 Post: 2.53 L/s
FEF 25-75 Pre: 2.02 L/s
FEF2575-%Change-Post: 25 %
FEF2575-%Pred-Post: 94 %
FEF2575-%Pred-Pre: 75 %
FEV1-%Change-Post: 8 %
FEV1-%Pred-Post: 61 %
FEV1-%Pred-Pre: 57 %
FEV1-Post: 1.71 L
FEV1-Pre: 1.57 L
FEV1FVC-%Change-Post: -1 %
FEV1FVC-%Pred-Pre: 107 %
FEV6-%Change-Post: 9 %
FEV6-%Pred-Post: 59 %
FEV6-%Pred-Pre: 53 %
FEV6-Post: 2.01 L
FEV6-Pre: 1.84 L
FEV6FVC-%Pred-Post: 102 %
FEV6FVC-%Pred-Pre: 102 %
FVC-%Change-Post: 9 %
FVC-%Pred-Post: 57 %
FVC-%Pred-Pre: 52 %
FVC-Post: 2.01 L
FVC-Pre: 1.84 L
Post FEV1/FVC ratio: 85 %
Post FEV6/FVC ratio: 100 %
Pre FEV1/FVC ratio: 86 %
Pre FEV6/FVC Ratio: 100 %
RV % pred: 85 %
RV: 1.56 L
TLC % pred: 73 %
TLC: 3.7 L

## 2023-02-19 NOTE — Patient Instructions (Signed)
 Full PFT Performed Today

## 2023-02-19 NOTE — Progress Notes (Signed)
 Full PFT Performed Today

## 2023-02-26 ENCOUNTER — Telehealth (HOSPITAL_BASED_OUTPATIENT_CLINIC_OR_DEPARTMENT_OTHER): Payer: BC Managed Care – PPO | Admitting: Pulmonary Disease

## 2023-02-26 ENCOUNTER — Encounter (HOSPITAL_BASED_OUTPATIENT_CLINIC_OR_DEPARTMENT_OTHER): Payer: Self-pay | Admitting: Pulmonary Disease

## 2023-02-26 ENCOUNTER — Other Ambulatory Visit (HOSPITAL_COMMUNITY): Payer: Self-pay

## 2023-02-26 DIAGNOSIS — J432 Centrilobular emphysema: Secondary | ICD-10-CM

## 2023-02-26 MED ORDER — BUDESONIDE-FORMOTEROL FUMARATE 160-4.5 MCG/ACT IN AERO
2.0000 | INHALATION_SPRAY | Freq: Two times a day (BID) | RESPIRATORY_TRACT | 11 refills | Status: AC
  Filled 2023-02-26: qty 10.2, 30d supply, fill #0

## 2023-02-26 MED ORDER — PREDNISONE 10 MG PO TABS: ORAL_TABLET | ORAL | 0 refills | Status: AC

## 2023-02-26 NOTE — Progress Notes (Signed)
 Virtual Visit via Video Note  I connected with Alicia Mosley on 02/26/23 at  2:00 PM EST by a video enabled telemedicine application and verified that I am speaking with the correct person using two identifiers.  Location: Patient: Home Provider: Home   I discussed the limitations of evaluation and management by telemedicine and the availability of in person appointments. The patient expressed understanding and agreed to proceed.   I discussed the assessment and treatment plan with the patient. The patient was provided an opportunity to ask questions and all were answered. The patient agreed with the plan and demonstrated an understanding of the instructions.   The patient was advised to call back or seek an in-person evaluation if the symptoms worsen or if the condition fails to improve as anticipated.  I provided 32 minutes of non-face-to-face time during this encounter.   Alicia Mosley Staff, MD   Subjective:   PATIENT ID: Alicia Mosley GENDER: female DOB: February 24, 1970, MRN: [id number]  Chief Complaint  Patient presents with   Follow-up    Virtual PFT follow-up    Reason for Visit: Follow-up for emphysema  Ms. Alicia Mosley is a 53 year old female active smoker with asthma, emphysema, OSA, HTN, HLD, GERD, epilepsy and migraine who presents for follow-up for emphysema.  Initial consult She recently had CT lung screen completed 08/2022 and was told she had emphysema. Her father had emphysema. She remains an active smoker. Unable to take wellbutrin due to prior seizures. Has tried patches and still needed cigarettes. 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 She reports history of dry mouth but now noticing chest congestion, productive cough daily in the mornings in the last year that has worsened some.   02/26/23 Since our last visit she has been inconsistently taking her medications due to cost. Has been taking Breyna  two puffs once a  day and Spiriva  2.5 mcg TWO puffs once a day. It has helped with her symptoms but does complain of dry mouth. Does have productive cough especially during the night and early morning. Occasional wheezing.  Social History: Active smoker 25-30 years. 1 ppd. Currently 1/2 ppd.  Past Medical History:  Diagnosis Date   Asthma    Emphysema of lung (HCC)    Hypertension    Migraine    Sleep apnea      Family History  Problem Relation Age of Onset   Hypertension Mother    Diabetes Mother    Stroke Mother    Heart failure Mother    Allergic rhinitis Neg Hx    Angioedema Neg Hx    Asthma Neg Hx    Eczema Neg Hx    Immunodeficiency Neg Hx    Urticaria Neg Hx      Social History   Occupational History   Not on file  Tobacco Use   Smoking status: Every Day    Current packs/day: 1.00    Types: Cigarettes    Passive exposure: Current   Smokeless tobacco: Never  Vaping Use   Vaping status: Never Used  Substance and Sexual Activity   Alcohol use: Yes    Comment: socially   Drug use: No   Sexual activity: Not on file    Allergies  Allergen Reactions   Ace Inhibitors Swelling    REACTION: angioedema   Lincomycin Hcl Hives and Swelling   Metronidazole Anaphylaxis    REACTION: anaphylaxis   Paroxetine Hcl Hives   Clindamycin  Throat swelling, hives   Clindamycin/Lincomycin Hives and Swelling   Duloxetine Itching and Swelling    angioedema   Flexeril [Cyclobenzaprine] Other (See Comments)    Facial swelling. No problems with breathing   Metoprolol Succinate Itching    REACTION: Itching     Outpatient Medications Prior to Visit  Medication Sig Dispense Refill   albuterol  (PROVENTIL  HFA;VENTOLIN  HFA) 108 (90 BASE) MCG/ACT inhaler Inhale 2 puffs into the lungs every 6 (six) hours as needed for wheezing or shortness of breath.     amLODIPine  Besylate (NORVASC  PO) Take 1 tablet by mouth daily.     atorvastatin (LIPITOR) 20 MG tablet Take 20 mg by mouth daily.      chlorthalidone  (HYGROTON ) 25 MG tablet Take 25 mg by mouth daily.     cloNIDine  (CATAPRES ) 0.1 MG tablet Take 0.1 mg by mouth daily.     ibuprofen  (ADVIL ) 600 MG tablet Take 1 tablet (600 mg total) by mouth as needed. 30 tablet 0   Tiotropium Bromide Monohydrate  (SPIRIVA  RESPIMAT) 2.5 MCG/ACT AERS Inhale 2 puffs into the lungs daily.     budesonide -formoterol  (BREYNA ) 160-4.5 MCG/ACT inhaler Inhale 2 puffs into the lungs 2 (two) times daily. 1 each 5   No facility-administered medications prior to visit.    Review of Systems  Constitutional:  Negative for chills, diaphoresis, fever, malaise/fatigue and weight loss.  HENT:  Negative for congestion.   Respiratory:  Positive for cough, sputum production, shortness of breath and wheezing. Negative for hemoptysis.   Cardiovascular:  Negative for chest pain, palpitations and leg swelling.     Objective:   There were no vitals filed for this visit. There is no height or weight on file to calculate BMI.     Physical Exam: General: Well-appearing, no acute distress HENT: [place], AT Eyes: EOMI, no scleral icterus Respiratory: No respiratory distress Neuro: AAO x4, CNII-XII grossly intact Psych: Normal mood, normal affect   Data Reviewed:  Imaging: CXR 02/05/15 - Normal CT Lung Screen 09/11/22 [facility] (report only) - Centrilobular emphysema noted. Multiple bilateral  pulmonary nodules are identified measuring up to maximum volume derived equivalent diameter of 7.5 mm (posterior right lower lobe on image 118). Multiple additional nodules ranging in size from 6.2 mm  up to 7.2 mm are identified bilaterally. No focal airspace consolidation. No pleural effusion.  PFT: 07/24/15 Spirometry FVC 1.85 (72%) FEV1 1.59 (73%) Ratio 84  Post change FVC 14%. Post change FEV1 20% Interpretation: Normal spirometry. Significant bronchodilator response  02/19/23 FVC 2.01 (57%) FEV1 1.71 (61%) Ratio 86  TLC 73% DLCO 87% Interpretation: No obstructive  defect. Moderate restrictive defect with normal DLCO. Partial bronchodilator response  Labs: CBC    Component Value Date/Time   WBC 10.0 06/29/2015 1952   RBC 4.91 06/29/2015 1952   HGB 16.7 (H) 05/15/2017 1641   HCT 49.0 (H) 05/15/2017 1641   PLT 362 06/29/2015 1952   MCV 90.0 06/29/2015 1952   MCH 32.2 06/29/2015 1952   MCHC 35.7 06/29/2015 1952   RDW 13.8 06/29/2015 1952   LYMPHSABS 2.8 06/29/2015 1952   MONOABS 0.8 06/29/2015 1952   EOSABS 0.0 06/29/2015 1952   BASOSABS 0.0 06/29/2015 1952   Absolute eos 06/29/15     Assessment & Plan:   Discussion: 53 year old female active smoker with asthma, emphysema, OSA, HTN, HLD, GERD, epilepsy and migraine who presents for follow-up for emphysema. Reviewed PFTs which demonstrates restrictive defect with partial bronchodilator response. Uncontrolled chronic bronchitis symptoms however not adherent to  current inhalers. Uncontrolled symptoms in mild exacerbation.  Discussed clinical course and management of COPD/asthma including bronchodilator regimen, preventive care including vaccinations and action plan for exacerbation.   Emphysema Asthma, mild exacerbation --Reviewed PFTs. Restrictive defect with normal DLCO --Prednisone  taper --CONTINUE Breyna  160-4.5 mcg TWO puffs in the morning and evening. Prescription sent to pharmacy --STOP Spiriva  2.5 mcg. Cost too high --CONTINUE Albuterol  AS NEEDED for shortness of breath or wheezing  Tobacco abuse Patient is an active smoker. Did not discuss at this vitis   Health Maintenance Immunization History  Administered Date(s) Administered   Influenza Whole 01/08/2009   Moderna Sars-Covid-2 Vaccination 10/03/2019, 10/31/2019   Td 02/17/1996, 09/21/2007   CT Lung Screen - enrolled  No orders of the defined types were placed in this encounter.  Meds ordered this encounter  Medications   predniSONE  (DELTASONE ) 10 MG tablet    Sig: Take 4 tablets (40 mg total) by mouth daily with  breakfast for 2 days, THEN 3 tablets (30 mg total) daily with breakfast for 2 days, THEN 2 tablets (20 mg total) daily with breakfast for 2 days, THEN 1 tablet (10 mg total) daily with breakfast for 2 days.    Dispense:  20 tablet    Refill:  0   budesonide -formoterol  (BREYNA ) 160-4.5 MCG/ACT inhaler    Sig: Inhale 2 puffs into the lungs 2 (two) times daily.    Dispense:  10.2 g    Refill:  11    Return in about 3 months (around 05/27/2023).  I have spent a total time of 33-minutes on the day of the appointment including chart review, data review, collecting history, coordinating care and discussing medical diagnosis and plan with the patient/family. Past medical history, allergies, medications were reviewed. Pertinent imaging, labs and tests included in this note have been reviewed and interpreted independently by me.  Eldon Zietlow Mosley Staff, MD [facility] 02/26/2023 4:39 PM

## 2023-02-26 NOTE — Patient Instructions (Signed)
 Emphysema Asthma, mild exacerbation --Reviewed PFTs. Restrictive defect with normal DLCO --Prednisone  taper --CONTINUE Breyna  160-4.5 mcg TWO puffs in the morning and evening. Prescription sent to pharmacy --STOP Spiriva  2.5 mcg. Cost too high --CONTINUE Albuterol  AS NEEDED for shortness of breath or wheezing

## 2023-06-18 ENCOUNTER — Emergency Department (HOSPITAL_BASED_OUTPATIENT_CLINIC_OR_DEPARTMENT_OTHER)
Admission: EM | Admit: 2023-06-18 | Discharge: 2023-06-19 | Disposition: A | Discharge status: Home / Self Care | Attending: Emergency Medicine | Admitting: Emergency Medicine

## 2023-06-18 ENCOUNTER — Other Ambulatory Visit: Payer: Self-pay

## 2023-06-18 ENCOUNTER — Encounter (HOSPITAL_BASED_OUTPATIENT_CLINIC_OR_DEPARTMENT_OTHER): Payer: Self-pay | Admitting: Emergency Medicine

## 2023-06-18 ENCOUNTER — Emergency Department (HOSPITAL_BASED_OUTPATIENT_CLINIC_OR_DEPARTMENT_OTHER): Admitting: Radiology

## 2023-06-18 DIAGNOSIS — M79602 Pain in left arm: Secondary | ICD-10-CM | POA: Insufficient documentation

## 2023-06-18 DIAGNOSIS — Z79899 Other long term (current) drug therapy: Secondary | ICD-10-CM | POA: Insufficient documentation

## 2023-06-18 MED ORDER — NAPROXEN 500 MG PO TABS: 500.0000 mg | ORAL_TABLET | Freq: Two times a day (BID) | ORAL | 0 refills | Status: AC

## 2023-06-18 MED ORDER — LIDOCAINE 5 % EX PTCH: 1.0000 | MEDICATED_PATCH | CUTANEOUS | 0 refills | Status: AC

## 2023-06-18 MED ORDER — METHOCARBAMOL 500 MG PO TABS: 500.0000 mg | ORAL_TABLET | Freq: Two times a day (BID) | ORAL | 0 refills | Status: AC

## 2023-06-18 NOTE — ED Triage Notes (Signed)
 Pt with left shoulder and upper arm pain x 2 months. Saw UC about a month ago and told it may be musculoskeletal and was prescribed medicine but feels no better.

## 2023-06-18 NOTE — ED Notes (Signed)
 Patient seen by staff leaving building.

## 2023-06-18 NOTE — ED Provider Notes (Signed)
 [facility] AT [facility] Provider Note   CSN: [id number] Arrival date & time: 06/18/23  1926     History  Chief Complaint  Patient presents with   Arm Pain    Alicia Mosley is a 53 y.o. female here for left arm pain.  This has been going on for the last few months.  Worse with movement.  She feels like when she moves her left arm she has sharp shooting pain from her shoulder to the elbow.  She initially thought this was due to laying on the arm when she sleeps.  No neck pain.  No numbness or weakness.  Feels like her left arm has been swollen for >6 months.  No redness or warmth. No recent injury or trauma.  HPI     Home Medications Prior to Admission medications   Medication Sig Start Date End Date Taking? Authorizing Provider  lidocaine (LIDODERM) 5 % Place 1 patch onto the skin daily. Remove & Discard patch within 12 hours or as directed by MD 06/18/23  Yes Mahlik Lenn A, PA-C  methocarbamol  (ROBAXIN ) 500 MG tablet Take 1 tablet (500 mg total) by mouth 2 (two) times daily. 06/18/23  Yes Audra Kagel A, PA-C  naproxen (NAPROSYN) 500 MG tablet Take 1 tablet (500 mg total) by mouth 2 (two) times daily. 06/18/23  Yes Tyrrell Stephens A, PA-C  albuterol  (PROVENTIL  HFA;VENTOLIN  HFA) 108 (90 BASE) MCG/ACT inhaler Inhale 2 puffs into the lungs every 6 (six) hours as needed for wheezing or shortness of breath.    [provider]  amLODIPine  Besylate (NORVASC  PO) Take 1 tablet by mouth daily.    [provider]  atorvastatin (LIPITOR) 20 MG tablet Take 20 mg by mouth daily. 11/14/14 02/05/23  [provider]  budesonide -formoterol  (BREYNA ) 160-4.5 MCG/ACT inhaler Inhale 2 puffs into the lungs 2 (two) times daily. 02/26/23   Quillian Brunt, MD  chlorthalidone  (HYGROTON ) 25 MG tablet Take 25 mg by mouth daily.    [provider]  cloNIDine  (CATAPRES ) 0.1 MG tablet Take 0.1 mg by mouth daily. 05/01/15   [provider]  Tiotropium Bromide Monohydrate  (SPIRIVA  RESPIMAT) 2.5 MCG/ACT AERS Inhale 2 puffs into the lungs daily. 02/05/23   Quillian Brunt, MD      Allergies    Ace inhibitors, Lincomycin hcl, Metronidazole, Paroxetine hcl, Clindamycin, Clindamycin/lincomycin, Duloxetine, Flexeril [cyclobenzaprine], and Metoprolol succinate    Review of Systems   Review of Systems  Constitutional: Negative.   HENT: Negative.    Respiratory: Negative.    Cardiovascular: Negative.   Gastrointestinal: Negative.   Genitourinary: Negative.   Musculoskeletal:        Left arm pain  Skin: Negative.   Neurological: Negative.   All other systems reviewed and are negative.   Physical Exam Updated Vital Signs BP (!) 158/105 (BP Location: Right Arm)   Pulse 96   Temp 98 F (36.7 C) (Temporal)   Resp 18   SpO2 98%  Physical Exam Vitals and nursing note reviewed.  Constitutional:      General: She is not in acute distress.    Appearance: She is well-developed. She is not ill-appearing, toxic-appearing or diaphoretic.  HENT:     Head: Atraumatic.  Eyes:     Pupils: Pupils are equal, round, and reactive to light.  Cardiovascular:     Rate and Rhythm: Normal rate.     Pulses: Normal pulses.          Radial  pulses are 2+ on the right side and 2+ on the left side.     Heart sounds: Normal heart sounds.  Pulmonary:     Effort: Pulmonary effort is normal. No respiratory distress.     Breath sounds: Normal breath sounds.  Abdominal:     General: Bowel sounds are normal. There is no distension.     Palpations: Abdomen is soft.  Musculoskeletal:        General: Normal range of motion.     Cervical back: Normal range of motion.     Comments: Difficulty with exam due to body habitus.  Full range of motion, worse with overhead movement left shoulder.  No obvious bony tenderness.  No midline C/T tenderness  Skin:    General: Skin is warm and dry.     Comments: No fluctuance, induration, erythema  or warmth.  No rashes or lesions  Neurological:     General: No focal deficit present.     Mental Status: She is alert.     Comments: Equal handgrip bilaterally Intact sensation  Psychiatric:        Mood and Affect: Mood normal.     ED Results / Procedures / Treatments   Labs (all labs ordered are listed, but only abnormal results are displayed) Labs Reviewed - No data to display  EKG None  Radiology DG Humerus Left Result Date: 06/18/2023 CLINICAL DATA:  Left arm pain, no known injury, initial encounter EXAM: LEFT HUMERUS - 2+ VIEW COMPARISON:  None Available. FINDINGS: There is no evidence of fracture or other focal bone lesions. Soft tissues are unremarkable. IMPRESSION: No acute abnormality noted. Electronically Signed   By: Violeta Grey M.D.   On: 06/18/2023 23:31   DG Shoulder Left Result Date: 06/18/2023 CLINICAL DATA:  Shoulder pain for 2 months, no known injury, initial encounter EXAM: LEFT SHOULDER - 2+ VIEW COMPARISON:  None Available. FINDINGS: Mild degenerative changes of the acromioclavicular joint are seen. No fracture or dislocation is seen. No soft tissue abnormality is noted. IMPRESSION: Mild degenerative change without acute abnormality. Electronically Signed   By: Violeta Grey M.D.   On: 06/18/2023 23:31    Procedures Procedures    Medications Ordered in ED Medications - No data to display  ED Course/ Medical Decision Making/ A&P   53 year old here for evaluation of left shoulder pain over the last few months.  Initially seen by urgent care thought likely musculoskeletal.  Muscle relaxers had helped however she ran out of the prescription.  No known trauma or injury.  On exam she has some pain diffusely throughout her shoulder however no erythema, warmth.  She has full passive range of motion however difficulty with range of motion overhead.  Her compartments are soft.  She feels like she has some swelling to her humerus diffusely however difficult exam due to  body habitus.  No erythema, warmth, fluctuance induration to suggest infectious process.  She is neurovascularly intact. Imaging personally viewed interpreted X-ray shoulder and humerus without acute fracture, dislocation does show some degenerative changes  Discussed results with patient.  Will treat symptomatically at this time.  She will follow-up for ultrasound outpatient however given symptoms.  Given sx for 6 months I have low suspicion for this.  No obvious infectious process.  Low suspicion for septic joint, gout, hemarthrosis, occult fracture, dislocation, VTE, ischemia.  Will have her follow-up outpatient, return for any worsening symptoms.  The patient has been appropriately medically screened and/or stabilized in the ED. I  have low suspicion for any other emergent medical condition which would require further screening, evaluation or treatment in the ED or require inpatient management.  Patient is hemodynamically stable and in no acute distress.  Patient able to ambulate in department prior to ED.  Evaluation does not show acute pathology that would require ongoing or additional emergent interventions while in the emergency department or further inpatient treatment.  I have discussed the diagnosis with the patient and answered all questions.  Pain is been managed while in the emergency department and patient has no further complaints prior to discharge.  Patient is comfortable with plan discussed in room and is stable for discharge at this time.  I have discussed strict return precautions for returning to the emergency department.  Patient was encouraged to follow-up with PCP/specialist refer to at discharge.                                  Medical Decision Making Amount and/or Complexity of Data Reviewed External Data Reviewed: labs, radiology and notes. Radiology: ordered and independent interpretation performed. Decision-making details documented in ED Course.  Risk OTC  drugs. Prescription drug management. Decision regarding hospitalization. Diagnosis or treatment significantly limited by social determinants of health.          Final Clinical Impression(s) / ED Diagnoses Final diagnoses:  Left arm pain    Rx / DC Orders ED Discharge Orders          Ordered    US  Venous Img Upper Uni Left        06/18/23 2347    naproxen (NAPROSYN) 500 MG tablet  2 times daily        06/18/23 2347    methocarbamol  (ROBAXIN ) 500 MG tablet  2 times daily        06/18/23 2347    lidocaine (LIDODERM) 5 %  Every 24 hours        06/18/23 2347              Myesha Stillion A, PA-C 06/18/23 2352    Horton, Sidra Dredge, DO 06/19/23 0017

## 2023-06-18 NOTE — Discharge Instructions (Addendum)
 It was a pleasure taking care of you here in the emergency department today.  I have written you for a few medications to help with your symptoms.  Robaxin -this is a muscle relaxer Naproxen-this is an anti-inflammatory do not take with any additional anti-inflammatory agent such as ibuprofen  or Aleve Lidocaine patches-places on the area of pain.  You leave on for 12 hours, remove for 12 hours.  Make sure to follow-up with orthopedics if your symptoms do not improve  I have written you for an outpatient ultrasound.  Follow the instructions in your discharge paperwork for this  Follow-up outpatient, return for any worsening symptoms
